# Patient Record
Sex: Male | Born: 1955 | Race: White | Hispanic: No | Marital: Single | Smoking: Never smoker
Health system: Southern US, Community
[De-identification: ages and names within clinical notes are randomized; demographics above are authoritative.]

## PROBLEM LIST (undated history)

## (undated) DIAGNOSIS — I10 Essential (primary) hypertension: Secondary | ICD-10-CM

## (undated) HISTORY — PX: FRACTURE SURGERY: SHX138

## (undated) HISTORY — PX: ANKLE FRACTURE SURGERY: SHX122

---

## 2018-07-12 ENCOUNTER — Ambulatory Visit: Admit: 2018-07-12 | Discharge: 2018-07-12 | Payer: PRIVATE HEALTH INSURANCE

## 2018-07-12 DIAGNOSIS — R079 Chest pain, unspecified: Secondary | ICD-10-CM

## 2018-07-12 NOTE — Progress Notes (Signed)
TREADMILL STRESS TEST          07/12/18    NAME:   Dustin Keller DOB:  Oct 07, 1956   MRN:   [id number] GENDER:   male   PCP:  No primary care provider on file. AGE: 62 y.o.   PCP Fax:  None       Indications:  Chest pain, unspecified type [R07.9]  ORDERING PROVIDER:  T. Benevich  ([place], [place])    VITALS:  BP 124/78    Pulse 62    Ht 5\' 6"  (1.676 m)    Wt 205 lb (93 kg)    BMI 33.09 kg/m??     Procedure:     The patient performed a treadmill exercise stress test using Bruce protocol, completing 12:31 minutes and completed an estimated workload of 17.2 metabolic equivalents (METS).  The test was terminated due to dyspnea, patient fatigue and target HR achieved.  The heart rate was 62 beats per minute at baseline and increased to 165 beats at peak exercise, which was 105% of maximum predicted heart rate. The rest blood pressure was 124/78 mm/Hg, and increased to 170/80 mm/Hg at peak exercise which is a normal response.    FINDINGS:   RESTING/PRE STRESS ECG: normal sinus rhythm    STRESS/POST EXERCISE ECG: normal sinus rhythm, sinus tachycardia, rare PVCs and 1 couplet; one 3 beat run SVT during mid exercise.      ST Segments Cannot Be Interpreted: Non Applicable    IMPRESSION:   ECG: No Ischemic ECG Changes.  Arrhythmia(s) noted:rare PVCs, 1 couplet and 1 3 beat run SVT during mid exercise .  Symptoms: The patient did not complain of chest pain.  Exercise Tolerance: Excellent   Duke score   7.5   Low risk       Electronically signed by: Broadus John, MD, [facility]  ____________________________________________  [facility]  [address], [address], [place], [place]  [address]  Tel:  [phone]      Fax:  [phone]  [email or web address]

## 2020-08-05 ENCOUNTER — Ambulatory Visit (INDEPENDENT_AMBULATORY_CARE_PROVIDER_SITE_OTHER): Payer: Medicare Other | Admitting: Student in an Organized Health Care Education/Training Program

## 2020-08-05 ENCOUNTER — Other Ambulatory Visit: Payer: Self-pay

## 2020-08-05 VITALS — BP 128/80 | HR 81 | Ht 67.0 in | Wt 219.6 lb

## 2020-08-05 DIAGNOSIS — Z131 Encounter for screening for diabetes mellitus: Secondary | ICD-10-CM | POA: Diagnosis not present

## 2020-08-05 DIAGNOSIS — F419 Anxiety disorder, unspecified: Secondary | ICD-10-CM

## 2020-08-05 DIAGNOSIS — Z0000 Encounter for general adult medical examination without abnormal findings: Secondary | ICD-10-CM | POA: Insufficient documentation

## 2020-08-05 DIAGNOSIS — E782 Mixed hyperlipidemia: Secondary | ICD-10-CM

## 2020-08-05 DIAGNOSIS — Z23 Encounter for immunization: Secondary | ICD-10-CM | POA: Diagnosis not present

## 2020-08-05 DIAGNOSIS — M1711 Unilateral primary osteoarthritis, right knee: Secondary | ICD-10-CM

## 2020-08-05 DIAGNOSIS — I10 Essential (primary) hypertension: Secondary | ICD-10-CM

## 2020-08-05 DIAGNOSIS — Z13220 Encounter for screening for lipoid disorders: Secondary | ICD-10-CM

## 2020-08-05 DIAGNOSIS — F329 Major depressive disorder, single episode, unspecified: Secondary | ICD-10-CM

## 2020-08-05 DIAGNOSIS — Z1329 Encounter for screening for other suspected endocrine disorder: Secondary | ICD-10-CM | POA: Diagnosis not present

## 2020-08-05 LAB — POCT GLYCOSYLATED HEMOGLOBIN (HGB A1C): Hemoglobin A1C: 5.3 % (ref 4.0–5.6)

## 2020-08-05 NOTE — Progress Notes (Signed)
SUBJECTIVE:   CHIEF COMPLAINT / HPI: establish care Preferred name: Elijah Washington  Complaint of R knee pain-patient had arthroscopy with partial meniscectomy in 2010.  He walks up to 3 miles at a time 3 to 5 days/week.  He has some worsening knee pain mostly on midline that is exacerbated with activity.  Denies clicking popping locking or giving out.  He takes ibuprofen 2-3 mornings per week which reduces the pain.  Denies GI upset.  Overall, patient feels well and is doing well. Current medications are lisinopril, paroxetine, as needed sildenafil.  He does not need any refills until January. HTN: Lisinopril 10 mg daily without negative side effects Anxiety: Paroxetine 20 mg daily without negative side effects.  Has been taking for approximately 6 years.  Started taking during his divorce and has been on it ever since.  Denies having any current anxiety or depression symptoms.  He is not interested in a trial off of the medication or any counseling at this time.  Patient endorses an overall healthy diet plus walking for exercise and bowling.  He is trying to lose weight in order to help with his right knee pain. He has completed his Covid vaccine series. His last colonoscopy 07/18/2015 and was normal with 10-year follow-up in 2026. Has history of trigger finger in 2016. PSA checked in 2017 was normal. Had kidney stones in 2018.  Denies tobacco use or illicit drug use.  Drinks approximately 1 beer per day. Patient is retired and moved to [place] from [place].  His brother lives locally and he likes the weather down here. Family history overall insignificant.  OBJECTIVE:   BP 128/80   Pulse 81   Ht 5\' 7"  (1.702 m)   Wt 219 lb 9.6 oz (99.6 kg)   SpO2 98%   BMI 34.39 kg/m   General: NAD, pleasant, able to participate in exam Head: /AT.   Eyes:  EOMI, PERRL. Nose:  Septum midline  Mouth:  MMM, tonsils non-erythematous, non-edematous.   Cardiac: RRR, normal heart sounds, no murmurs. 2+  radial and PT pulses bilaterally Respiratory: CTAB, normal effort, No wheezes, rales or rhonchi Abdomen: soft, nontender, nondistended, no hepatic or splenomegaly, +BS Knee: Observation: no gross deformity Palpation: tenderness to medial joint line with palpation and with valgus/varus stress. No swelling or bakers cyst ROM: full and symmetrical Strength: full and symmetrical Special tests: positive pain with patellar grind on R only Skin: warm and dry, no rashes noted Neuro: alert and oriented, no focal deficits Psych: Normal affect and mood  ASSESSMENT/PLAN:   Encounter for medical examination to establish care Reviewed PMHx. Does not need refills at this time - recommended mychart activation - flu shot given today - baseline labs collected  Hyperlipemia Slightly elevated lipid panel. Sent patient message about lifestyle change vs starting statin. Waiting for his preference. Either way, recheck in 3-4 months  Hypertension Lisinopril 10mg  daily.  Does not need refills at this time. Well controlled in office today and asymptomatic  Osteoarthritis Patient attempting to lose weight to improve symptoms. No recent worsening or injury.  Discussed treatment options for shared decision making. - recommend decreasing NSAID use and substitute tylenol if it works - continue walking and weight loss and substitute lower impact exercises if possible such as cycling, swimming, elliptical. - given home rehab exercises - if not improved in a couple months, return to consider steroid injection  Anxiety and depression Chronic, stable. On long-term paroxetine.  Declines a trial off medication or counseling  [place]

## 2020-08-05 NOTE — Patient Instructions (Signed)
It was a pleasure to see you today!  To summarize our discussion for this visit:  We are checking some baseline labs today and I will contact you with any abnormal results. Please set up mychart for an easy way to communicate in the future  For your knee pain, I would recommend decreasing ibuprofen use and instead try tylenol. I've attached some rehab exercises to strengthen your knee and help prevent further discomfort. We can consider steroid knee injections in the future if impacting your daily activity.  Some additional health maintenance measures we should update are: Health Maintenance Due  Topic Date Due   Hepatitis C Screening  Never done   TETANUS/TDAP  Never done   COLONOSCOPY  Never done   PNA vac Low Risk Adult (1 of 2 - PCV13) Never done   INFLUENZA VACCINE  Never done      Please return to our clinic to see me in about 6 months or sooner if needed.  Call the clinic at [phone] if your symptoms worsen or you have any concerns.   Thank you for allowing me to take part in your care,  Dr. Jamelle Rushing   Journal for Nurse Practitioners, 15(4), [phone]. Retrieved August 14, 2018 from [email or web address]">  Knee Exercises Ask your health care provider which exercises are safe for you. Do exercises exactly as told by your health care provider and adjust them as directed. It is normal to feel mild stretching, pulling, tightness, or discomfort as you do these exercises. Stop right away if you feel sudden pain or your pain gets worse. Do not begin these exercises until told by your health care provider. Stretching and range-of-motion exercises These exercises warm up your muscles and joints and improve the movement and flexibility of your knee. These exercises also help to relieve pain and swelling. Knee extension, prone 1. Lie on your abdomen (prone position) on a bed. 2. Place your left / right knee just beyond the edge of the surface so your knee is  not on the bed. You can put a towel under your left / right thigh just above your kneecap for comfort. 3. Relax your leg muscles and allow gravity to straighten your knee (extension). You should feel a stretch behind your left / right knee. 4. Hold this position for __________ seconds. 5. Scoot up so your knee is supported between repetitions. Repeat __________ times. Complete this exercise __________ times a day. Knee flexion, active  1. Lie on your back with both legs straight. If this causes back discomfort, bend your left / right knee so your foot is flat on the floor. 2. Slowly slide your left / right heel back toward your buttocks. Stop when you feel a gentle stretch in the front of your knee or thigh (flexion). 3. Hold this position for __________ seconds. 4. Slowly slide your left / right heel back to the starting position. Repeat __________ times. Complete this exercise __________ times a day. Quadriceps stretch, prone  1. Lie on your abdomen on a firm surface, such as a bed or padded floor. 2. Bend your left / right knee and hold your ankle. If you cannot reach your ankle or pant leg, loop a belt around your foot and grab the belt instead. 3. Gently pull your heel toward your buttocks. Your knee should not slide out to the side. You should feel a stretch in the front of your thigh and knee (quadriceps). 4. Hold this position for __________ seconds. Repeat __________ times.  Complete this exercise __________ times a day. Hamstring, supine 1. Lie on your back (supine position). 2. Loop a belt or towel over the ball of your left / right foot. The ball of your foot is on the walking surface, right under your toes. 3. Straighten your left / right knee and slowly pull on the belt to raise your leg until you feel a gentle stretch behind your knee (hamstring). ? Do not let your knee bend while you do this. ? Keep your other leg flat on the floor. 4. Hold this position for __________  seconds. Repeat __________ times. Complete this exercise __________ times a day. Strengthening exercises These exercises build strength and endurance in your knee. Endurance is the ability to use your muscles for a long time, even after they get tired. Quadriceps, isometric This exercise stretches the muscles in front of your thigh (quadriceps) without moving your knee joint (isometric). 1. Lie on your back with your left / right leg extended and your other knee bent. Put a rolled towel or small pillow under your knee if told by your health care provider. 2. Slowly tense the muscles in the front of your left / right thigh. You should see your kneecap slide up toward your hip or see increased dimpling just above the knee. This motion will push the back of the knee toward the floor. 3. For __________ seconds, hold the muscle as tight as you can without increasing your pain. 4. Relax the muscles slowly and completely. Repeat __________ times. Complete this exercise __________ times a day. Straight leg raises This exercise stretches the muscles in front of your thigh (quadriceps) and the muscles that move your hips (hip flexors). 1. Lie on your back with your left / right leg extended and your other knee bent. 2. Tense the muscles in the front of your left / right thigh. You should see your kneecap slide up or see increased dimpling just above the knee. Your thigh may even shake a bit. 3. Keep these muscles tight as you raise your leg 4-6 inches (10-15 cm) off the floor. Do not let your knee bend. 4. Hold this position for __________ seconds. 5. Keep these muscles tense as you lower your leg. 6. Relax your muscles slowly and completely after each repetition. Repeat __________ times. Complete this exercise __________ times a day. Hamstring, isometric 1. Lie on your back on a firm surface. 2. Bend your left / right knee about __________ degrees. 3. Dig your left / right heel into the surface as if  you are trying to pull it toward your buttocks. Tighten the muscles in the back of your thighs (hamstring) to "dig" as hard as you can without increasing any pain. 4. Hold this position for __________ seconds. 5. Release the tension gradually and allow your muscles to relax completely for __________ seconds after each repetition. Repeat __________ times. Complete this exercise __________ times a day. Hamstring curls If told by your health care provider, do this exercise while wearing ankle weights. Begin with __________ lb weights. Then increase the weight by 1 lb (0.5 kg) increments. Do not wear ankle weights that are more than __________ lb. 1. Lie on your abdomen with your legs straight. 2. Bend your left / right knee as far as you can without feeling pain. Keep your hips flat against the floor. 3. Hold this position for __________ seconds. 4. Slowly lower your leg to the starting position. Repeat __________ times. Complete this exercise __________ times a  day. Squats This exercise strengthens the muscles in front of your thigh and knee (quadriceps). 1. Stand in front of a table, with your feet and knees pointing straight ahead. You may rest your hands on the table for balance but not for support. 2. Slowly bend your knees and lower your hips like you are going to sit in a chair. ? Keep your weight over your heels, not over your toes. ? Keep your lower legs upright so they are parallel with the table legs. ? Do not let your hips go lower than your knees. ? Do not bend lower than told by your health care provider. ? If your knee pain increases, do not bend as low. 3. Hold the squat position for __________ seconds. 4. Slowly push with your legs to return to standing. Do not use your hands to pull yourself to standing. Repeat __________ times. Complete this exercise __________ times a day. Wall slides This exercise strengthens the muscles in front of your thigh and knee (quadriceps). 1. Lean  your back against a smooth wall or door, and walk your feet out 18-24 inches (46-61 cm) from it. 2. Place your feet hip-width apart. 3. Slowly slide down the wall or door until your knees bend __________ degrees. Keep your knees over your heels, not over your toes. Keep your knees in line with your hips. 4. Hold this position for __________ seconds. Repeat __________ times. Complete this exercise __________ times a day. Straight leg raises This exercise strengthens the muscles that rotate the leg at the hip and move it away from your body (hip abductors). 1. Lie on your side with your left / right leg in the top position. Lie so your head, shoulder, knee, and hip line up. You may bend your bottom knee to help you keep your balance. 2. Roll your hips slightly forward so your hips are stacked directly over each other and your left / right knee is facing forward. 3. Leading with your heel, lift your top leg 4-6 inches (10-15 cm). You should feel the muscles in your outer hip lifting. ? Do not let your foot drift forward. ? Do not let your knee roll toward the ceiling. 4. Hold this position for __________ seconds. 5. Slowly return your leg to the starting position. 6. Let your muscles relax completely after each repetition. Repeat __________ times. Complete this exercise __________ times a day. Straight leg raises This exercise stretches the muscles that move your hips away from the front of the pelvis (hip extensors). 1. Lie on your abdomen on a firm surface. You can put a pillow under your hips if that is more comfortable. 2. Tense the muscles in your buttocks and lift your left / right leg about 4-6 inches (10-15 cm). Keep your knee straight as you lift your leg. 3. Hold this position for __________ seconds. 4. Slowly lower your leg to the starting position. 5. Let your leg relax completely after each repetition. Repeat __________ times. Complete this exercise __________ times a day. This  information is not intended to replace advice given to you by your health care provider. Make sure you discuss any questions you have with your health care provider. Document Revised: 08/15/2018 Document Reviewed: 08/15/2018 Elsevier Patient Education  2020 [employer].

## 2020-08-05 NOTE — Assessment & Plan Note (Signed)
Reviewed PMHx. Does not need refills at this time - recommended mychart activation - flu shot given today - baseline labs collected

## 2020-08-06 ENCOUNTER — Encounter: Payer: Self-pay | Admitting: Student in an Organized Health Care Education/Training Program

## 2020-08-06 DIAGNOSIS — E785 Hyperlipidemia, unspecified: Secondary | ICD-10-CM | POA: Insufficient documentation

## 2020-08-06 DIAGNOSIS — M1990 Unspecified osteoarthritis, unspecified site: Secondary | ICD-10-CM | POA: Insufficient documentation

## 2020-08-06 DIAGNOSIS — I10 Essential (primary) hypertension: Secondary | ICD-10-CM | POA: Insufficient documentation

## 2020-08-06 DIAGNOSIS — F32A Depression, unspecified: Secondary | ICD-10-CM | POA: Insufficient documentation

## 2020-08-06 LAB — TSH: TSH: 1.2 u[IU]/mL (ref 0.450–4.500)

## 2020-08-06 LAB — COMPREHENSIVE METABOLIC PANEL
ALT: 32 IU/L (ref 0–44)
AST: 28 IU/L (ref 0–40)
Albumin/Globulin Ratio: 2.1 (ref 1.2–2.2)
Albumin: 4.4 g/dL (ref 3.8–4.8)
Alkaline Phosphatase: 77 IU/L (ref 44–121)
BUN/Creatinine Ratio: 11 (ref 10–24)
BUN: 12 mg/dL (ref 8–27)
Bilirubin Total: 0.6 mg/dL (ref 0.0–1.2)
CO2: 22 mmol/L (ref 20–29)
Calcium: 9.2 mg/dL (ref 8.6–10.2)
Chloride: 108 mmol/L — ABNORMAL HIGH (ref 96–106)
Creatinine, Ser: 1.07 mg/dL (ref 0.76–1.27)
GFR calc Af Amer: 83 mL/min/{1.73_m2} (ref >59)
GFR calc non Af Amer: 72 mL/min/{1.73_m2} (ref >59)
Globulin, Total: 2.1 g/dL (ref 1.5–4.5)
Glucose: 85 mg/dL (ref 65–99)
Potassium: 4.4 mmol/L (ref 3.5–5.2)
Sodium: 142 mmol/L (ref 134–144)
Total Protein: 6.5 g/dL (ref 6.0–8.5)

## 2020-08-06 LAB — LIPID PANEL
Chol/HDL Ratio: 4.1 ratio (ref 0.0–5.0)
Cholesterol, Total: 203 mg/dL — ABNORMAL HIGH (ref 100–199)
HDL: 50 mg/dL (ref >39)
LDL Chol Calc (NIH): 127 mg/dL — ABNORMAL HIGH (ref 0–99)
Triglycerides: 148 mg/dL (ref 0–149)
VLDL Cholesterol Cal: 26 mg/dL (ref 5–40)

## 2020-08-06 LAB — CBC
Hematocrit: 44.6 % (ref 37.5–51.0)
Hemoglobin: 14.8 g/dL (ref 13.0–17.7)
MCH: 30.3 pg (ref 26.6–33.0)
MCHC: 33.2 g/dL (ref 31.5–35.7)
MCV: 91 fL (ref 79–97)
Platelets: 196 10*3/uL (ref 150–450)
RBC: 4.89 x10E6/uL (ref 4.14–5.80)
RDW: 12.9 % (ref 11.6–15.4)
WBC: 5.6 10*3/uL (ref 3.4–10.8)

## 2020-08-06 NOTE — Assessment & Plan Note (Signed)
Slightly elevated lipid panel. Sent patient message about lifestyle change vs starting statin. Waiting for his preference. Either way, recheck in 3-4 months

## 2020-08-06 NOTE — Assessment & Plan Note (Signed)
Lisinopril 10mg  daily.  Does not need refills at this time. Well controlled in office today and asymptomatic

## 2020-08-06 NOTE — Assessment & Plan Note (Signed)
Chronic, stable. On long-term paroxetine.  Declines a trial off medication or counseling

## 2020-08-06 NOTE — Assessment & Plan Note (Signed)
Patient attempting to lose weight to improve symptoms. No recent worsening or injury.  Discussed treatment options for shared decision making. - recommend decreasing NSAID use and substitute tylenol if it works - continue walking and weight loss and substitute lower impact exercises if possible such as cycling, swimming, elliptical. - given home rehab exercises - if not improved in a couple months, return to consider steroid injection

## 2020-09-08 ENCOUNTER — Ambulatory Visit (HOSPITAL_COMMUNITY)
Admission: EM | Admit: 2020-09-08 | Discharge: 2020-09-08 | Disposition: A | Discharge status: Home / Self Care | Payer: Medicare Other | Attending: Urgent Care | Admitting: Urgent Care

## 2020-09-08 ENCOUNTER — Encounter (HOSPITAL_COMMUNITY): Payer: Self-pay | Admitting: Emergency Medicine

## 2020-09-08 ENCOUNTER — Other Ambulatory Visit: Payer: Self-pay

## 2020-09-08 DIAGNOSIS — M5432 Sciatica, left side: Secondary | ICD-10-CM

## 2020-09-08 DIAGNOSIS — M7918 Myalgia, other site: Secondary | ICD-10-CM

## 2020-09-08 HISTORY — DX: Essential (primary) hypertension: I10

## 2020-09-08 MED ORDER — PREDNISONE 20 MG PO TABS: ORAL_TABLET | ORAL | 0 refills | Status: DC

## 2020-09-08 MED ORDER — TIZANIDINE HCL 4 MG PO TABS: 4.0000 mg | ORAL_TABLET | Freq: Every day | ORAL | 0 refills | Status: DC

## 2020-09-08 NOTE — ED Provider Notes (Signed)
[facility]   MRN: [id number] DOB: September 29, 1956  Subjective:   Elijah Washington is a 64 y.o. male presenting for 3-day history of progressively worsening right posterior hip/buttock pain that radiates into his posterior thigh, intermittently causes tightness of the thigh and tingling of the left foot.  Patient states that symptoms started after he did not some work with his friends and friends.  States that he was cleaning them using a stepping letter, actual letter as well.  Denies fall, trauma, hematuria, flank tenderness.  Has history of a kidney stone but states this feels very different.  Denies history of arthritis of his back.  Denies heart history, heart failure, CAD.  No current facility-administered medications for this encounter.  Current Outpatient Medications:  .  lisinopril (ZESTRIL) 10 MG tablet, Take 10 mg by mouth daily., Disp: , Rfl:  .  PARoxetine (PAXIL) 20 MG tablet, Take 20 mg by mouth daily., Disp: , Rfl:  .  sildenafil (VIAGRA) 25 MG tablet, Take 25 mg by mouth daily as needed for erectile dysfunction., Disp: , Rfl:    No Known Allergies  Past Medical History:  Diagnosis Date  . Hypertension      Past Surgical History:  Procedure Laterality Date  . ANKLE FRACTURE SURGERY      Family History  Problem Relation Age of Onset  . Heart failure Mother   . Cancer Father     Social History   Tobacco Use  . Smoking status: Never Smoker  . Smokeless tobacco: Never Used  Substance Use Topics  . Alcohol use: Not on file  . Drug use: Not on file    ROS   Objective:   Vitals: BP 135/87 (BP Location: Left Arm)   Pulse 76   Temp 98.2 F (36.8 C) (Oral)   Resp 18   SpO2 100%   Physical Exam Constitutional:      General: He is not in acute distress.    Appearance: Normal appearance. He is well-developed and normal weight. He is not ill-appearing, toxic-appearing or diaphoretic.  HENT:     Head: Normocephalic and atraumatic.      Right Ear: External ear normal.     Left Ear: External ear normal.     Nose: Nose normal.     Mouth/Throat:     Pharynx: Oropharynx is clear.  Eyes:     General: No scleral icterus.       Right eye: No discharge.        Left eye: No discharge.     Extraocular Movements: Extraocular movements intact.     Conjunctiva/sclera: Conjunctivae normal.     Pupils: Pupils are equal, round, and reactive to light.  Cardiovascular:     Rate and Rhythm: Normal rate.  Pulmonary:     Effort: Pulmonary effort is normal.  Musculoskeletal:     Cervical back: Normal range of motion.     Lumbar back: No swelling, edema, deformity, signs of trauma, lacerations, spasms, tenderness or bony tenderness. Normal range of motion. Positive left straight leg raise test. Negative right straight leg raise test. No scoliosis.  Skin:    General: Skin is warm and dry.  Neurological:     Mental Status: He is alert and oriented to person, place, and time.     Motor: No weakness.     Coordination: Coordination normal.     Gait: Gait normal.     Deep Tendon Reflexes: Reflexes normal.  Psychiatric:  Mood and Affect: Mood normal.        Behavior: Behavior normal.        Thought Content: Thought content normal.        Judgment: Judgment normal.      Assessment and Plan :   PDMP not reviewed this encounter.  1. Sciatica of left side   2. Pain in left buttock     Will manage conservatively for sciatica, back strain with prednisone (at patient's request) and muscle relaxant, rest and modification of physical activity.  Anticipatory guidance provided.  Consider imaging if symptoms persist or worsen.  Counseled patient on potential for adverse effects with medications prescribed/recommended today, ER and return-to-clinic precautions discussed, patient verbalized understanding.    Wallis Bamberg, PA-C 09/08/20 1715

## 2020-09-08 NOTE — ED Triage Notes (Signed)
Pt c/o left hip pain radiating down his left leg. Pt states on Friday he was up on a ladder cleaning the ceiling fans. Pt states he has trouble with ROM and standing for long periods of time.

## 2020-09-12 ENCOUNTER — Encounter: Payer: Self-pay | Admitting: Student in an Organized Health Care Education/Training Program

## 2020-09-30 ENCOUNTER — Ambulatory Visit: Payer: Medicare Other | Attending: Internal Medicine

## 2020-09-30 ENCOUNTER — Other Ambulatory Visit (HOSPITAL_BASED_OUTPATIENT_CLINIC_OR_DEPARTMENT_OTHER): Payer: Self-pay | Admitting: Internal Medicine

## 2020-09-30 DIAGNOSIS — Z23 Encounter for immunization: Secondary | ICD-10-CM

## 2020-09-30 NOTE — Progress Notes (Signed)
   [id number]  Name:  Quirino Kakos. Massmann    MRN: [id number] DOB: Jun 19, 1956  09/30/2020  Mr. Surber was observed post Covid-19 immunization for 15 minutes without incident. He was provided with Vaccine Information Sheet and instruction to access the V-Safe system.   Mr. Raucci was instructed to call 911 with any severe reactions post vaccine: Marland Kitchen Difficulty breathing  . Swelling of face and throat  . A fast heartbeat  . A bad rash all over body  . Dizziness and weakness   Immunizations Administered    Name Date Dose VIS Date Route   Pfizer COVID-19 Vaccine 09/30/2020 10:17 AM 0.3 mL 08/27/2020 Intramuscular   Manufacturer: [employer], [employer]   Lot: [id number]   NDC: [address]-5

## 2020-10-01 MED FILL — PFIZER-BIONTECH COVID-19 VA: 30 | 1 days supply | Qty: 0 | Fill #0

## 2020-11-12 ENCOUNTER — Encounter: Payer: Self-pay | Admitting: Student in an Organized Health Care Education/Training Program

## 2020-11-12 MED ORDER — PAROXETINE HCL 20 MG PO TABS
20.0000 mg | ORAL_TABLET | Freq: Every day | ORAL | 0 refills | Status: DC
Start: 2020-11-12 — End: 2021-02-04

## 2020-11-12 MED ORDER — LISINOPRIL 10 MG PO TABS
10.0000 mg | ORAL_TABLET | Freq: Every day | ORAL | 0 refills | Status: DC
Start: 2020-11-12 — End: 2021-02-04

## 2021-01-30 ENCOUNTER — Other Ambulatory Visit: Payer: Self-pay

## 2021-01-30 ENCOUNTER — Ambulatory Visit (INDEPENDENT_AMBULATORY_CARE_PROVIDER_SITE_OTHER): Payer: Medicare Other | Admitting: Student in an Organized Health Care Education/Training Program

## 2021-01-30 ENCOUNTER — Encounter: Payer: Self-pay | Admitting: Student in an Organized Health Care Education/Training Program

## 2021-01-30 VITALS — BP 110/62 | HR 69 | Wt 210.8 lb

## 2021-01-30 DIAGNOSIS — E782 Mixed hyperlipidemia: Secondary | ICD-10-CM

## 2021-01-30 DIAGNOSIS — I10 Essential (primary) hypertension: Secondary | ICD-10-CM | POA: Diagnosis not present

## 2021-01-30 DIAGNOSIS — Z23 Encounter for immunization: Secondary | ICD-10-CM | POA: Diagnosis not present

## 2021-01-30 DIAGNOSIS — M1711 Unilateral primary osteoarthritis, right knee: Secondary | ICD-10-CM | POA: Diagnosis not present

## 2021-01-30 NOTE — Patient Instructions (Signed)
It was a pleasure to see you today!  To summarize our discussion for this visit:  We are checking your cholesterol today and can then decide if medication or lifestyle changes alone are indicated. I'll let you know results via mychart  You will get your pneumonia vaccine as well as Tdap today.   Please see me again if you have any desire to treat your knee pain in a different way  Some additional health maintenance measures we should update are: Health Maintenance Due  Topic Date Due  . Hepatitis C Screening  Never done  . TETANUS/TDAP  Never done  . COLONOSCOPY (Pts 45-69yrs Insurance coverage will need to be confirmed)  Never done  . PNA vac Low Risk Adult (1 of 2 - PCV13) Never done  .   Call the clinic at [phone] if your symptoms worsen or you have any concerns.   Thank you for allowing me to take part in your care,  Dr. Jamelle Rushing

## 2021-01-30 NOTE — Progress Notes (Signed)
    SUBJECTIVE:   CHIEF COMPLAINT / HPI: cholesterol check  Hyperlipidemia-patient had elevated lipids at last check and has made lifestyle changes including cutting out all alcohol for several weeks and really cutting back on any sweets.  He bowls occasionally and plans to start golfing now that the weather is warming up but does not get any regular exercise at this time.  Knee pain-patient endorses mild bilateral knee pain which is present when he tried to run several months ago but otherwise does not notice it on a day-to-day basis.  He does not want to do anything for it today but just wanted to bring it up to let me know.  OBJECTIVE:   BP 110/62   Pulse 69   Wt 210 lb 12.8 oz (95.6 kg)   SpO2 97%   BMI 33.02 kg/m   Physical Exam Vitals and nursing note reviewed.  Constitutional:      General: He is not in acute distress.    Appearance: He is obese. He is not ill-appearing.  HENT:     Head: Normocephalic.  Cardiovascular:     Rate and Rhythm: Normal rate and regular rhythm.     Pulses: Normal pulses.  Pulmonary:     Effort: Pulmonary effort is normal.     Breath sounds: Normal breath sounds.  Neurological:     Mental Status: He is alert.  Psychiatric:        Mood and Affect: Mood normal.        Thought Content: Thought content normal.    ASSESSMENT/PLAN:   Pneumococcal vaccination administered at current visit Prevnar 20 given today  Osteoarthritis Chronic, unchanged.  Recommended low impact exercise such as swimming, stationary bike  Hypertension Well-controlled, asymptomatic  Hyperlipemia Repeat lipid panel today s/p lifestyle changes ASCVD risk calculated >7.5% -Recommend moderate to high intensity statin starting now Recheck in about 6 months     Leeroy Bock, DO [facility]

## 2021-01-31 DIAGNOSIS — Z23 Encounter for immunization: Secondary | ICD-10-CM | POA: Insufficient documentation

## 2021-01-31 LAB — LIPID PANEL
Chol/HDL Ratio: 4.5 ratio (ref 0.0–5.0)
Cholesterol, Total: 192 mg/dL (ref 100–199)
HDL: 43 mg/dL (ref >39)
LDL Chol Calc (NIH): 129 mg/dL — ABNORMAL HIGH (ref 0–99)
Triglycerides: 108 mg/dL (ref 0–149)
VLDL Cholesterol Cal: 20 mg/dL (ref 5–40)

## 2021-01-31 MED ORDER — ATORVASTATIN CALCIUM 40 MG PO TABS: 40.0000 mg | ORAL_TABLET | Freq: Every day | ORAL | 3 refills | Status: DC

## 2021-01-31 NOTE — Assessment & Plan Note (Addendum)
Repeat lipid panel today s/p lifestyle changes ASCVD risk calculated >7.5% -Recommend moderate to high intensity statin starting now Recheck in about 6 months

## 2021-01-31 NOTE — Assessment & Plan Note (Signed)
Prevnar 20 given today. 

## 2021-01-31 NOTE — Assessment & Plan Note (Signed)
Chronic, unchanged.  Recommended low impact exercise such as swimming, stationary bike

## 2021-01-31 NOTE — Assessment & Plan Note (Signed)
Well-controlled, asymptomatic

## 2021-02-04 ENCOUNTER — Other Ambulatory Visit: Payer: Self-pay | Admitting: Student in an Organized Health Care Education/Training Program

## 2021-04-21 ENCOUNTER — Ambulatory Visit
Admission: RE | Admit: 2021-04-21 | Discharge: 2021-04-21 | Disposition: A | Discharge status: Home / Self Care | Payer: Medicare Other | Source: Ambulatory Visit | Attending: Family Medicine | Admitting: Family Medicine

## 2021-04-21 ENCOUNTER — Ambulatory Visit (INDEPENDENT_AMBULATORY_CARE_PROVIDER_SITE_OTHER): Payer: Medicare Other | Admitting: Student in an Organized Health Care Education/Training Program

## 2021-04-21 ENCOUNTER — Encounter: Payer: Self-pay | Admitting: Student in an Organized Health Care Education/Training Program

## 2021-04-21 ENCOUNTER — Other Ambulatory Visit: Payer: Self-pay

## 2021-04-21 VITALS — BP 145/86 | HR 62 | Ht 67.0 in | Wt 212.2 lb

## 2021-04-21 DIAGNOSIS — M1711 Unilateral primary osteoarthritis, right knee: Secondary | ICD-10-CM | POA: Diagnosis not present

## 2021-04-21 DIAGNOSIS — M23303 Other meniscus derangements, unspecified medial meniscus, right knee: Secondary | ICD-10-CM | POA: Diagnosis not present

## 2021-04-21 DIAGNOSIS — M792 Neuralgia and neuritis, unspecified: Secondary | ICD-10-CM

## 2021-04-21 DIAGNOSIS — M5410 Radiculopathy, site unspecified: Secondary | ICD-10-CM

## 2021-04-21 MED ORDER — NAPROXEN SODIUM 550 MG PO TABS: 550.0000 mg | ORAL_TABLET | Freq: Two times a day (BID) | ORAL | 0 refills | Status: AC

## 2021-04-21 NOTE — Patient Instructions (Signed)
It was a pleasure to see you today!  To summarize our discussion for this visit: For your left buttock/leg pain- I'd like to take a look at your back to look for signs of nerve impingement so am ordering an xray. The antiinflammatories should help with that as well. Please use topical voltaren gel for an extra help. For your knee pain- we will get an xray to confirm what we suspect is arthritis and degenerative joint disease (likely medial meniscus). Use naproxen for 5-7 days with food as well as the home exercises for several weeks. If not seeing improvement, please return and we can discuss formal PT and/or steroid injections.   Some additional health maintenance measures we should update are: Health Maintenance Due  Topic Date Due   Hepatitis C Screening  Never done   TETANUS/TDAP  Never done   COLONOSCOPY (Pts 45-24yrs Insurance coverage will need to be confirmed)  Never done   Zoster Vaccines- Shingrix (1 of 2) Never done   PNA vac Low Risk Adult (1 of 2 - PCV13) 07/27/2019   COVID-19 Vaccine (4 - Booster for Pfizer series) 01/28/2021     Please return to our clinic to see me 4-6 weeks.  Call the clinic at [phone] if your symptoms worsen or you have any concerns.   Thank you for allowing me to take part in your care,  Dr. Jamelle Rushing  Knee Exercises Ask your health care provider which exercises are safe for you. Do exercises exactly as told by your health care provider and adjust them as directed. It is normal to feel mild stretching, pulling, tightness, or discomfort as you do these exercises. Stop right away if you feel sudden pain or your pain gets worse. Do not begin these exercises until told by your health care provider. Stretching and range-of-motion exercises These exercises warm up your muscles and joints and improve the movement and flexibility of your knee. These exercises also help to relieve pain andswelling. Knee extension, prone Lie on your abdomen (prone  position) on a bed. Place your left / right knee just beyond the edge of the surface so your knee is not on the bed. You can put a towel under your left / right thigh just above your kneecap for comfort. Relax your leg muscles and allow gravity to straighten your knee (extension). You should feel a stretch behind your left / right knee. Hold this position for __________ seconds. Scoot up so your knee is supported between repetitions. Repeat __________ times. Complete this exercise __________ times a day. Knee flexion, active  Lie on your back with both legs straight. If this causes back discomfort, bend your left / right knee so your foot is flat on the floor. Slowly slide your left / right heel back toward your buttocks. Stop when you feel a gentle stretch in the front of your knee or thigh (flexion). Hold this position for __________ seconds. Slowly slide your left / right heel back to the starting position. Repeat __________ times. Complete this exercise __________ times a day. Quadriceps stretch, prone  Lie on your abdomen on a firm surface, such as a bed or padded floor. Bend your left / right knee and hold your ankle. If you cannot reach your ankle or pant leg, loop a belt around your foot and grab the belt instead. Gently pull your heel toward your buttocks. Your knee should not slide out to the side. You should feel a stretch in the front of your thigh  and knee (quadriceps). Hold this position for __________ seconds. Repeat __________ times. Complete this exercise __________ times a day. Hamstring, supine Lie on your back (supine position). Loop a belt or towel over the ball of your left / right foot. The ball of your foot is on the walking surface, right under your toes. Straighten your left / right knee and slowly pull on the belt to raise your leg until you feel a gentle stretch behind your knee (hamstring). Do not let your knee bend while you do this. Keep your other leg flat on  the floor. Hold this position for __________ seconds. Repeat __________ times. Complete this exercise __________ times a day. Strengthening exercises These exercises build strength and endurance in your knee. Endurance is theability to use your muscles for a long time, even after they get tired. Quadriceps, isometric This exercise stretches the muscles in front of your thigh (quadriceps) without moving your knee joint (isometric). Lie on your back with your left / right leg extended and your other knee bent. Put a rolled towel or small pillow under your knee if told by your health care provider. Slowly tense the muscles in the front of your left / right thigh. You should see your kneecap slide up toward your hip or see increased dimpling just above the knee. This motion will push the back of the knee toward the floor. For __________ seconds, hold the muscle as tight as you can without increasing your pain. Relax the muscles slowly and completely. Repeat __________ times. Complete this exercise __________ times a day. Straight leg raises This exercise stretches the muscles in front of your thigh (quadriceps) and the muscles that move your hips (hip flexors). Lie on your back with your left / right leg extended and your other knee bent. Tense the muscles in the front of your left / right thigh. You should see your kneecap slide up or see increased dimpling just above the knee. Your thigh may even shake a bit. Keep these muscles tight as you raise your leg 4-6 inches (10-15 cm) off the floor. Do not let your knee bend. Hold this position for __________ seconds. Keep these muscles tense as you lower your leg. Relax your muscles slowly and completely after each repetition. Repeat __________ times. Complete this exercise __________ times a day. Hamstring, isometric Lie on your back on a firm surface. Bend your left / right knee about __________ degrees. Dig your left / right heel into the surface  as if you are trying to pull it toward your buttocks. Tighten the muscles in the back of your thighs (hamstring) to "dig" as hard as you can without increasing any pain. Hold this position for __________ seconds. Release the tension gradually and allow your muscles to relax completely for __________ seconds after each repetition. Repeat __________ times. Complete this exercise __________ times a day. Hamstring curls If told by your health care provider, do this exercise while wearing ankle weights. Begin with __________ lb weights. Then increase the weight by 1 lb (0.5 kg) increments. Do not wear ankle weights that are more than __________ lb. Lie on your abdomen with your legs straight. Bend your left / right knee as far as you can without feeling pain. Keep your hips flat against the floor. Hold this position for __________ seconds. Slowly lower your leg to the starting position. Repeat __________ times. Complete this exercise __________ times a day. Squats This exercise strengthens the muscles in front of your thigh and knee (  quadriceps). Stand in front of a table, with your feet and knees pointing straight ahead. You may rest your hands on the table for balance but not for support. Slowly bend your knees and lower your hips like you are going to sit in a chair. Keep your weight over your heels, not over your toes. Keep your lower legs upright so they are parallel with the table legs. Do not let your hips go lower than your knees. Do not bend lower than told by your health care provider. If your knee pain increases, do not bend as low. Hold the squat position for __________ seconds. Slowly push with your legs to return to standing. Do not use your hands to pull yourself to standing. Repeat __________ times. Complete this exercise __________ times a day. Wall slides This exercise strengthens the muscles in front of your thigh and knee (quadriceps). Lean your back against a smooth wall or  door, and walk your feet out 18-24 inches (46-61 cm) from it. Place your feet hip-width apart. Slowly slide down the wall or door until your knees bend __________ degrees. Keep your knees over your heels, not over your toes. Keep your knees in line with your hips. Hold this position for __________ seconds. Repeat __________ times. Complete this exercise __________ times a day. Straight leg raises This exercise strengthens the muscles that rotate the leg at the hip and move it away from your body (hip abductors). Lie on your side with your left / right leg in the top position. Lie so your head, shoulder, knee, and hip line up. You may bend your bottom knee to help you keep your balance. Roll your hips slightly forward so your hips are stacked directly over each other and your left / right knee is facing forward. Leading with your heel, lift your top leg 4-6 inches (10-15 cm). You should feel the muscles in your outer hip lifting. Do not let your foot drift forward. Do not let your knee roll toward the ceiling. Hold this position for __________ seconds. Slowly return your leg to the starting position. Let your muscles relax completely after each repetition. Repeat __________ times. Complete this exercise __________ times a day. Straight leg raises This exercise stretches the muscles that move your hips away from the front of the pelvis (hip extensors). Lie on your abdomen on a firm surface. You can put a pillow under your hips if that is more comfortable. Tense the muscles in your buttocks and lift your left / right leg about 4-6 inches (10-15 cm). Keep your knee straight as you lift your leg. Hold this position for __________ seconds. Slowly lower your leg to the starting position. Let your leg relax completely after each repetition. Repeat __________ times. Complete this exercise __________ times a day. This information is not intended to replace advice given to you by your health care  provider. Make sure you discuss any questions you have with your healthcare provider. Document Revised: 08/15/2018 Document Reviewed: 08/15/2018 Elsevier Patient Education  2022 [employer].

## 2021-04-21 NOTE — Progress Notes (Signed)
SUBJECTIVE:   CHIEF COMPLAINT / HPI: knee and sciatic pain  R Knee pain- chronic. Arthroscope in the distant past and had no pain for an extended time. Continues to gradually worsen and interfere with daily activities but does not prevent him from doing anything. Has not had swelling or redness. Has not tried treatments. No locking or clicking.  L Sciatic pain- more active than usual the past few weeks with bowling tournaments and travel. Has flare ups occasionally and had one this weekend when coming home from bowling. Tried tylenol and helped to relieve the pain temporarily. It is a constant achy pain the radiates down his leg and has some numbness on outer left foot occasionally. Has had frequent air travel as well in the past few weeks. No swelling, skin changes in leg. No SOB. This is similar to past flares but about 4-5 pain level and currently a 2. In the past has had level 10 pain. Worse when goes from sitting to standing position and worse in the morning. Improves with movement. Woke him up from sleep Sunday night. Was able to get back to sleep with tylenol.  Bowled in several tournaments. 30-35 games in 10 days.   OBJECTIVE:   BP (!) 145/86   Pulse 62   Ht 5\' 7"  (1.702 m)   Wt 212 lb 3.2 oz (96.3 kg)   SpO2 99%   BMI 33.24 kg/m   Physical Exam Vitals and nursing note reviewed.  Constitutional:      General: He is not in acute distress.    Appearance: Normal appearance. He is not ill-appearing or toxic-appearing.  Pulmonary:     Effort: Pulmonary effort is normal.  Musculoskeletal:     Thoracic back: Normal.     Lumbar back: Tenderness (tenderness to Left upper mid buttock) present. No bony tenderness. Normal range of motion. Positive left straight leg raise test. Negative right straight leg raise test.     Right hip: Normal.     Left hip: Normal range of motion. Normal strength.     Right upper leg: Normal.     Left upper leg: No swelling, deformity, tenderness or bony  tenderness.     Right knee: Crepitus (with mcmurray) present. No swelling, deformity, effusion, erythema or bony tenderness. Normal range of motion. Tenderness (medial joint line) present. No LCL laxity, MCL laxity, ACL laxity or PCL laxity. Normal alignment and normal patellar mobility. Normal pulse.     Instability Tests: Anterior drawer test negative. Posterior drawer test negative. Anterior Lachman test negative. Medial McMurray test negative and lateral McMurray test negative.     Left knee: Normal.     Instability Tests: Anterior drawer test negative. Posterior drawer test negative. Anterior Lachman test negative. Medial McMurray test negative and lateral McMurray test negative.     Right lower leg: Normal.     Left lower leg: Normal.  Neurological:     Mental Status: He is alert.  Psychiatric:        Mood and Affect: Mood normal.        Behavior: Behavior normal.   ASSESSMENT/PLAN:   Osteoarthritis R knee arthritis. Likely degenerative after remote history of injury. Suspect degenerative meniscal pathology contributing.  Standing xray and complete of R knee reveled 4 compartment disease.  Recommended home rehab exercises and tylenol/naproxen as needed for 4-6 weeks.  Return if not improved and consider steroid injection vs ortho referral for repeat arthroscope.  Neuralgia Patient has h/o sciatic nerve compression and states  that this pain is similar. History and exam would agree with this diagnosis but also has component on spinal nerve root compression. Obtaining lumbar xray today to evaluate for vertebral pathology.  Recommended home PT as well as naproxen x1 week Return if not improving in a couple weeks. Can consider steroid injection vs formal PT     Leeroy Bock, DO [facility]

## 2021-04-23 ENCOUNTER — Encounter: Payer: Self-pay | Admitting: Student in an Organized Health Care Education/Training Program

## 2021-04-23 DIAGNOSIS — M792 Neuralgia and neuritis, unspecified: Secondary | ICD-10-CM | POA: Insufficient documentation

## 2021-04-23 NOTE — Assessment & Plan Note (Addendum)
R knee arthritis. Likely degenerative after remote history of injury. Suspect degenerative meniscal pathology contributing.  Standing xray and complete of R knee reveled 4 compartment disease.  Recommended home rehab exercises and tylenol/naproxen as needed for 4-6 weeks.  Return if not improved and consider steroid injection vs ortho referral for repeat arthroscope.

## 2021-04-23 NOTE — Assessment & Plan Note (Signed)
Patient has h/o sciatic nerve compression and states that this pain is similar. History and exam would agree with this diagnosis but also has component on spinal nerve root compression. Obtaining lumbar xray today to evaluate for vertebral pathology.  Recommended home PT as well as naproxen x1 week Return if not improving in a couple weeks. Can consider steroid injection vs formal PT

## 2021-05-04 ENCOUNTER — Other Ambulatory Visit: Payer: Self-pay | Admitting: Student in an Organized Health Care Education/Training Program

## 2021-08-01 ENCOUNTER — Other Ambulatory Visit: Payer: Self-pay | Admitting: Student in an Organized Health Care Education/Training Program

## 2021-10-08 ENCOUNTER — Ambulatory Visit (INDEPENDENT_AMBULATORY_CARE_PROVIDER_SITE_OTHER): Payer: Medicare Other

## 2021-10-08 ENCOUNTER — Encounter: Payer: Self-pay | Admitting: Family Medicine

## 2021-10-08 ENCOUNTER — Ambulatory Visit (INDEPENDENT_AMBULATORY_CARE_PROVIDER_SITE_OTHER): Payer: Medicare Other | Admitting: Family Medicine

## 2021-10-08 ENCOUNTER — Other Ambulatory Visit: Payer: Self-pay

## 2021-10-08 VITALS — BP 128/69 | HR 86 | Ht 67.0 in | Wt 220.2 lb

## 2021-10-08 DIAGNOSIS — I10 Essential (primary) hypertension: Secondary | ICD-10-CM

## 2021-10-08 DIAGNOSIS — Z1211 Encounter for screening for malignant neoplasm of colon: Secondary | ICD-10-CM

## 2021-10-08 DIAGNOSIS — Z23 Encounter for immunization: Secondary | ICD-10-CM

## 2021-10-08 DIAGNOSIS — F32A Depression, unspecified: Secondary | ICD-10-CM

## 2021-10-08 DIAGNOSIS — M7989 Other specified soft tissue disorders: Secondary | ICD-10-CM

## 2021-10-08 DIAGNOSIS — Z1159 Encounter for screening for other viral diseases: Secondary | ICD-10-CM | POA: Diagnosis not present

## 2021-10-08 DIAGNOSIS — F419 Anxiety disorder, unspecified: Secondary | ICD-10-CM

## 2021-10-08 MED ORDER — PAROXETINE HCL 20 MG PO TABS: 20.0000 mg | ORAL_TABLET | Freq: Every day | ORAL | 0 refills | Status: DC

## 2021-10-08 MED ORDER — LISINOPRIL 10 MG PO TABS
10.0000 mg | ORAL_TABLET | Freq: Every day | ORAL | 0 refills | Status: DC
Start: 2021-10-08 — End: 2022-01-22

## 2021-10-08 NOTE — Progress Notes (Signed)
    SUBJECTIVE:   CHIEF COMPLAINT / HPI:   Elijah Washington is a 65 yo M who presents for the below.   Annual check up No concerns today. He is feeling good and recently joined a gym. He has increased his water intake. He denies tobacco use hx, he denies alcohol use. States he takes all medications as prescribed. Desires his flu and covid vaccine today. Admits he was told to follow up in 10 years for colonoscopy. Plans to get prior medical records and send to clinic for clarification.   PERTINENT  PMH / PSH: OA, HLD  OBJECTIVE:   BP 128/69   Pulse 86   Ht 5\' 7"  (1.702 m)   Wt 220 lb 3.2 oz (99.9 kg)   SpO2 99%   BMI 34.49 kg/m   Office Visit from 10/08/2021 in [facility]  PHQ-9 Total Score 0      Physical Exam Vitals reviewed.  Constitutional:      General: He is not in acute distress.    Appearance: Normal appearance. He is not ill-appearing, toxic-appearing or diaphoretic.  HENT:     Head: Normocephalic.     Right Ear: External ear normal.     Left Ear: External ear normal.     Nose: Nose normal.  Eyes:     Conjunctiva/sclera: Conjunctivae normal.  Cardiovascular:     Rate and Rhythm: Normal rate and regular rhythm.  Pulmonary:     Effort: Pulmonary effort is normal. No respiratory distress.     Breath sounds: Normal breath sounds. No wheezing, rhonchi or rales.  Abdominal:     General: Abdomen is flat. Bowel sounds are normal.     Palpations: Abdomen is soft.     Tenderness: There is no abdominal tenderness. There is no right CVA tenderness, left CVA tenderness or guarding.  Musculoskeletal:     Cervical back: Neck supple. No tenderness.     Right lower leg: Edema present.     Left lower leg: Edema present.     Comments: 1+ pitting edema  Lymphadenopathy:     Cervical: No cervical adenopathy.  Neurological:     Mental Status: He is alert and oriented to person, place, and time.     Gait: Gait normal.  Psychiatric:         Mood and Affect: Mood normal.        Behavior: Behavior normal.     ASSESSMENT/PLAN:   1. Essential hypertension At goal. Continue current medications.  - Basic Metabolic Panel - lisinopril (ZESTRIL) 10 MG tablet; Take 1 tablet (10 mg total) by mouth daily.  Dispense: 90 tablet; Refill: 0  2. Screening for colon cancer Discussed obtain prior record from PCP to determine timing of next colonoscopy. Patient plans to bring records in when available.   3. Screening for viral disease - Hepatitis C antibody (reflex, frozen specimen)  4. Need for immunization against influenza - Flu Vaccine QUAD High Dose(Fluad)  5. Anxiety and depression PHQ9 reviewed.  - PARoxetine (PAXIL) 20 MG tablet; Take 1 tablet (20 mg total) by mouth daily.  Dispense: 90 tablet; Refill: 0  6. Swelling of lower extremity Found on exam. 1+ pitting edema. No hx of heart failure. Will obtain BNP, consider need for echo for further evaluation v. Compression stocking for dependent edema.  - Brain natriuretic peptide  Borgarnes, DO [facility]

## 2021-10-08 NOTE — Patient Instructions (Addendum)
Thank you for coming in today.  Today we discussed obtaining records from your previous doctor's office to know when it is time for another colonoscopy.  We have these records available please send them to the office and we will scan them in your chart. We also discussed your blood pressure which is at goal today please continue current medications. When labs are available I will communicate via MyChart. Thank you for getting your COVID and flu shot today.  Dr. Salvadore Dom

## 2021-10-09 LAB — BRAIN NATRIURETIC PEPTIDE: BNP: 12.4 pg/mL (ref 0.0–100.0)

## 2021-10-09 LAB — BASIC METABOLIC PANEL
BUN/Creatinine Ratio: 11 (ref 10–24)
BUN: 12 mg/dL (ref 8–27)
CO2: 27 mmol/L (ref 20–29)
Calcium: 9.5 mg/dL (ref 8.6–10.2)
Chloride: 106 mmol/L (ref 96–106)
Creatinine, Ser: 1.13 mg/dL (ref 0.76–1.27)
Glucose: 99 mg/dL (ref 70–99)
Potassium: 4.6 mmol/L (ref 3.5–5.2)
Sodium: 144 mmol/L (ref 134–144)
eGFR: 71 mL/min/{1.73_m2} (ref >59)

## 2021-10-09 LAB — HCV INTERPRETATION

## 2021-10-09 LAB — HCV AB W REFLEX TO QUANT PCR: HCV Ab: <0.1 s/co ratio (ref 0.0–0.9)

## 2021-11-02 IMAGING — DX DG LUMBAR SPINE COMPLETE 4+V
5 series · 5 of 5 positions shown · non-contrast
Comparison: None.

CLINICAL DATA: Right lower extremity radiculopathy

EXAM:
LUMBAR SPINE - COMPLETE 4+ VIEW

[dg lumbar spine complete 4 +v (1 of 5)]
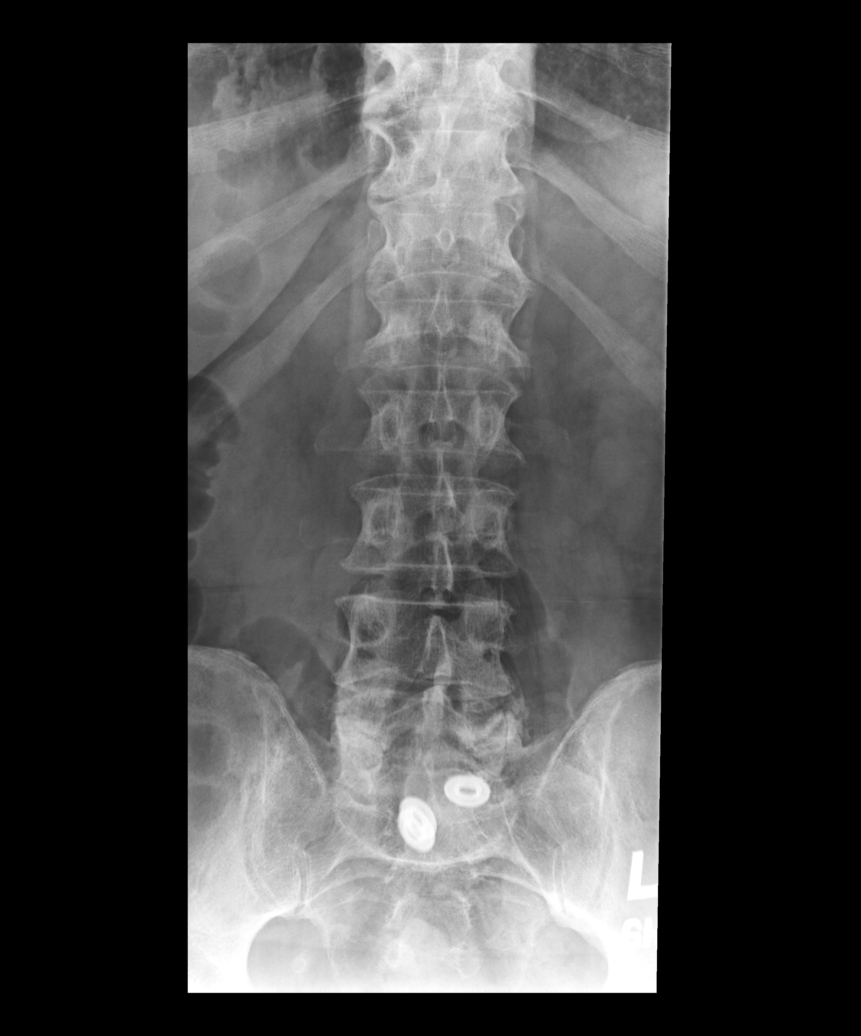

[dg lumbar spine complete 4 +v (2 of 5)]
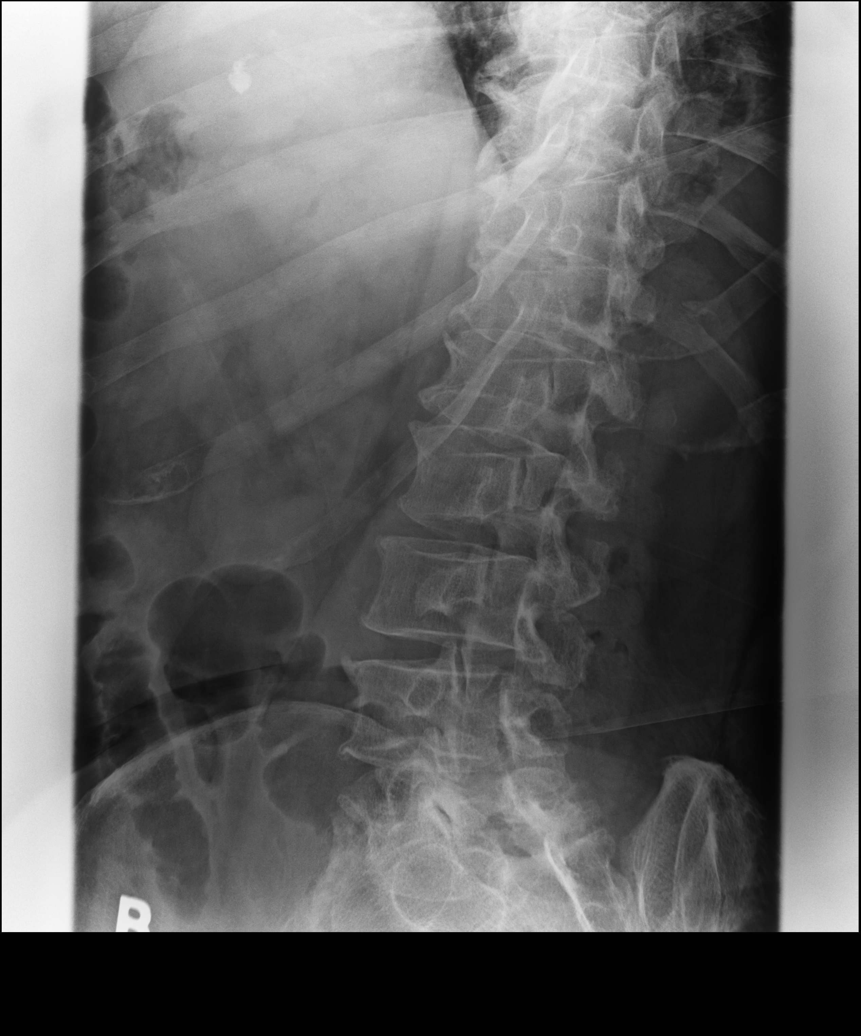

[dg lumbar spine complete 4 +v (3 of 5)]
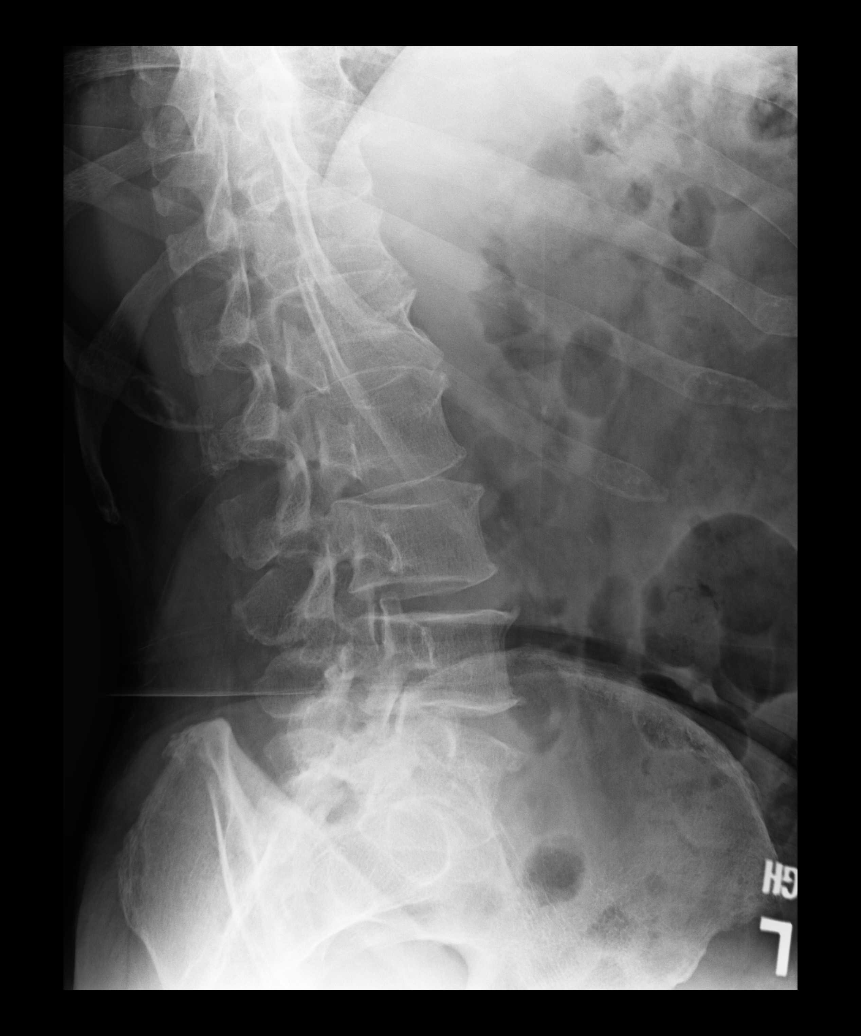

[dg lumbar spine complete 4 +v (4 of 5)]
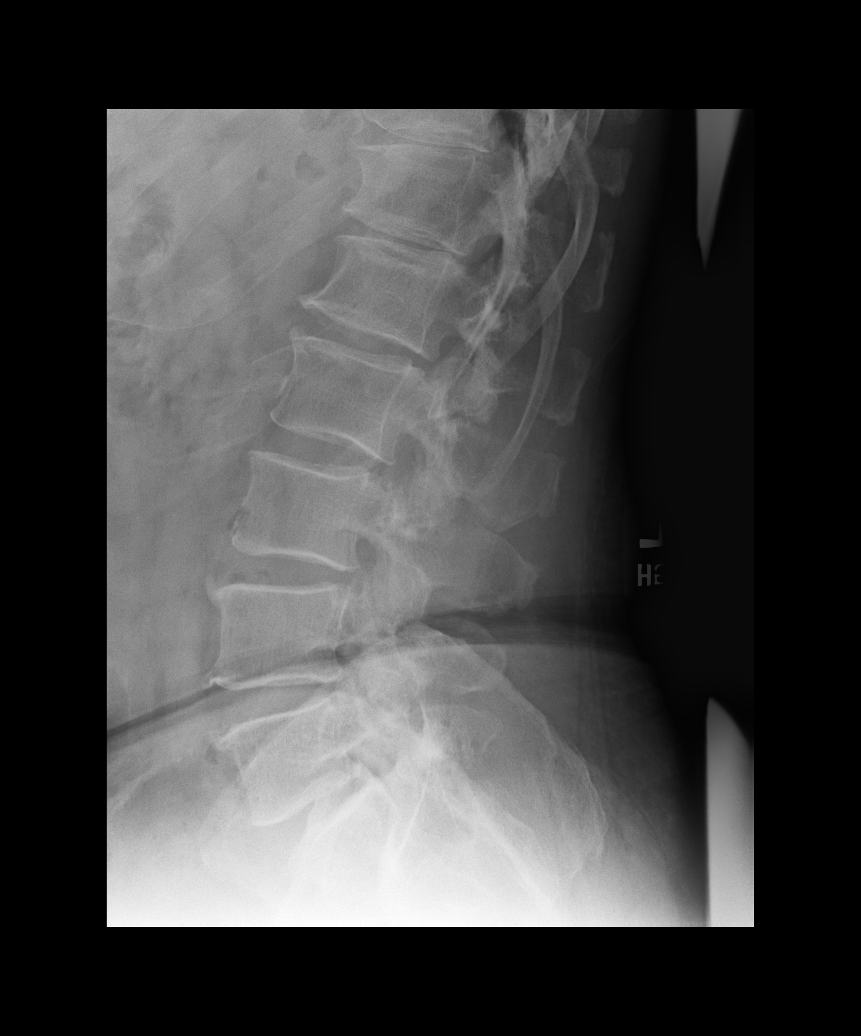

[dg lumbar spine complete 4 +v (5 of 5)]
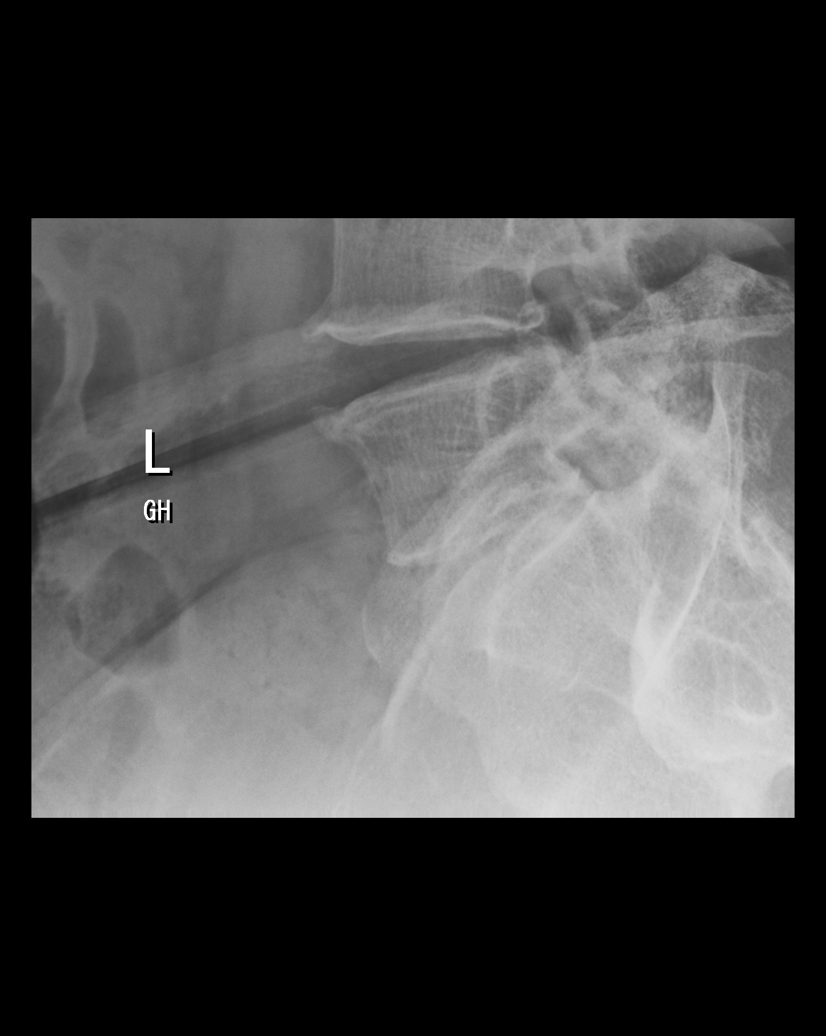

[5 of 5 positions shown; findings below may reference images not displayed]

FINDINGS: Frontal, bilateral oblique, lateral views of the lumbar spine are
obtained. There are 5 non-rib-bearing lumbar type vertebral bodies
in grossly normal alignment. No acute displaced fractures. There is
multilevel spondylosis greatest at T12/L1, L1/L2, and L5/S1.
Significant multilevel facet hypertrophy greatest at L4-5 and L5-S1.
Sacroiliac joints are normal.
IMPRESSION: 1. Multilevel spondylosis and facet hypertrophy.  No acute fracture.

## 2021-12-18 ENCOUNTER — Telehealth: Payer: Self-pay

## 2021-12-18 NOTE — Telephone Encounter (Signed)
Received phone call from Letha, NP with [facility] regarding abnormal PAD screening. Reports that left leg showed mild abnormality. Report is being faxed over to our office.   Called patient. Patient does not want to schedule appointment at this time.   Patient states that he will schedule follow up if recommended by PCP.   Please advise.   Veronda Prude, RN

## 2021-12-21 NOTE — Telephone Encounter (Signed)
Scheduled patient follow up appointment for 12/24/21 at 2:30.  Talbot Grumbling, RN

## 2021-12-24 ENCOUNTER — Other Ambulatory Visit: Payer: Self-pay

## 2021-12-24 ENCOUNTER — Encounter: Payer: Self-pay | Admitting: Family Medicine

## 2021-12-24 ENCOUNTER — Ambulatory Visit (INDEPENDENT_AMBULATORY_CARE_PROVIDER_SITE_OTHER): Payer: Medicare Other | Admitting: Family Medicine

## 2021-12-24 VITALS — BP 118/72 | HR 84 | Wt 218.8 lb

## 2021-12-24 DIAGNOSIS — E782 Mixed hyperlipidemia: Secondary | ICD-10-CM

## 2021-12-24 DIAGNOSIS — I10 Essential (primary) hypertension: Secondary | ICD-10-CM | POA: Diagnosis not present

## 2021-12-24 DIAGNOSIS — I739 Peripheral vascular disease, unspecified: Secondary | ICD-10-CM | POA: Diagnosis not present

## 2021-12-24 DIAGNOSIS — M25512 Pain in left shoulder: Secondary | ICD-10-CM

## 2021-12-24 NOTE — Progress Notes (Signed)
SUBJECTIVE:   CHIEF COMPLAINT / HPI:   Abnormal circulation screening Received a home health screening and was told his PAD screening was abnormal. He denies leg pain at rest or with activity. He has not noticed any skin changes or wound that are unable to heal. Does have a history of ankle fracture.   Left shoulder pain 1 month ago began with exercising. He describes it as an ache and radiates into his neck. He has noticed if he backs off some of his exercises the pain self resolves. He denies history of shoulder trauma. Did seem to have some "catching" around nov/October. Works out several times a week.   Hypertension - Medications: Lisinopril 10 mg daily - Compliance: Yes - Denies any SOB, CP, vision changes - Exercise: Got to gym several times a week  PERTINENT  PMH / PSH: HLD, OA  OBJECTIVE:   BP 118/72    Pulse 84    Wt 218 lb 12.8 oz (99.2 kg)    SpO2 98%    BMI 34.27 kg/m   Physical Exam Vitals reviewed.  Constitutional:      General: He is not in acute distress.    Appearance: Normal appearance. He is not ill-appearing, toxic-appearing or diaphoretic.  Cardiovascular:     Rate and Rhythm: Normal rate and regular rhythm.     Pulses: Normal pulses.     Heart sounds: Normal heart sounds.  Pulmonary:     Effort: Pulmonary effort is normal.     Breath sounds: Normal breath sounds.  Musculoskeletal:     Right lower leg: No tenderness. Edema present.     Left lower leg: No tenderness. Edema present.     Right foot: Normal pulse.     Left foot: Normal pulse.     Comments: No swelling, ecchymoses.  No gross deformity. No TTP. FROM. Negative [place], [place]. Strength 5/5 with empty can and resisted internal/external rotation. Negative apprehension. NV intact distally.   Skin:    General: Skin is warm.     Findings: No bruising, erythema, lesion or rash.  Neurological:     Mental Status: He is alert and oriented to person, place, and time.  Psychiatric:         Mood and Affect: Mood normal.        Behavior: Behavior normal.   ASSESSMENT/PLAN:   1. PAD (peripheral artery disease) (HCC) Abnormal home health screening. Right leg 1.18, left leg 0.80 (indicating mild PAD). Asymptomatic. No evidence of skin changes and normal pulses. Repeat lipid panel and consider increasing Lipitor to 80 mg daily. Will refer to vascular surgery for further investigation if needed.  - Lipid Panel - Ambulatory referral to Vascular Surgery  2. Mixed hyperlipidemia - f/u Lipid Panel - increase lipitor if needed as above.   3. Left shoulder pain, unspecified chronicity 1 month ago with exercises. More chronically with catching several months ago. Improvement with decreasing intensity of workout. Could consider impingement or rotator cuff syndrome. Shoulder exam unremarkable with well preserved strength. We discussed continuing to rest and activity as tolerated.  Heat before activity and ice after.  An oral NSAID such as ibuprofen or Aleve as needed for a short period of time.  If desired, use topical Voltaren gel.  If this continues the issue plan to follow-up and discuss an exercise program to strengthen muscles or physical therapy.  4. Hypertension, unspecified type Initially elevated upon arrival. Repeat normotensive. Continue current medications. Continue to monitor at subsequent visits.  Gerlene Fee, [place]

## 2021-12-24 NOTE — Patient Instructions (Addendum)
Thank you for coming in today. We discussed the results of your circulation screening which indicated peripheral artery disease.  I will refer you to vascular surgery for further work-up if needed.  Today we will check your cholesterol to see if we need to increase your statin medication. We discussed your left shoulder pain.  Your exam was normal today.  We discussed continuing to rest and activity as tolerated.  Heat before activity and ice after.  You can use an oral NSAID such as ibuprofen or Aleve as needed for a short period of time.  If you desire something topical consider Voltaren gel.  If this continues the issue please follow-up and we can discuss an exercise program to strengthen muscles or physical therapy. Your blood pressure was elevated today upon recheck it was normal continue your current medications.

## 2021-12-25 LAB — LIPID PANEL
Chol/HDL Ratio: 3 ratio (ref 0.0–5.0)
Cholesterol, Total: 131 mg/dL (ref 100–199)
HDL: 44 mg/dL (ref >39)
LDL Chol Calc (NIH): 68 mg/dL (ref 0–99)
Triglycerides: 102 mg/dL (ref 0–149)
VLDL Cholesterol Cal: 19 mg/dL (ref 5–40)

## 2022-01-14 ENCOUNTER — Other Ambulatory Visit: Payer: Self-pay

## 2022-01-14 DIAGNOSIS — I739 Peripheral vascular disease, unspecified: Secondary | ICD-10-CM

## 2022-01-15 ENCOUNTER — Encounter: Payer: Self-pay | Admitting: Surgery

## 2022-01-15 ENCOUNTER — Ambulatory Visit (HOSPITAL_COMMUNITY)
Admission: RE | Admit: 2022-01-15 | Discharge: 2022-01-15 | Disposition: A | Discharge status: Home / Self Care | Payer: Medicare Other | Source: Ambulatory Visit | Attending: Surgery | Admitting: Surgery

## 2022-01-15 ENCOUNTER — Encounter (HOSPITAL_COMMUNITY): Payer: Medicare Other

## 2022-01-15 ENCOUNTER — Encounter: Payer: Medicare Other | Admitting: Surgery

## 2022-01-15 ENCOUNTER — Ambulatory Visit: Payer: Medicare Other | Admitting: Surgery

## 2022-01-15 ENCOUNTER — Other Ambulatory Visit: Payer: Self-pay

## 2022-01-15 VITALS — BP 131/81 | HR 72 | Temp 98.3°F | Resp 20 | Ht 67.0 in | Wt 218.0 lb

## 2022-01-15 DIAGNOSIS — I739 Peripheral vascular disease, unspecified: Secondary | ICD-10-CM

## 2022-01-15 NOTE — Progress Notes (Signed)
? ?[facility] ? ?Patient name: Elijah Washington. Jungbluth MRN: [id number] DOB: 09/09/1956 Sex: male ? ? ?REQUESTING PROVIDER:  ? ? Terisa Starr, M.D. ? ? ?REASON FOR CONSULT:  ?  ?PAD ? ?HISTORY OF PRESENT ILLNESS:  ? ?Elijah Washington is a 66 y.o. male, who is referred for evaluation of peripheral vascular disease.  He underwent an insurance screening which revealed abnormal findings on the left.  He is here today for further evaluation.  He denies any symptoms of claudication.  He denies rest pain.  He denies nonhealing wounds.  He does state that he had a injury back in high school in his left ankle requiring hardware.  Since that time his left foot has not been the same.  There is no family history of peripheral vascular disease. ? ?He is medically managed for hypertension with an ACE inhibitor.  He takes a statin for hypercholesterolemia.  He is a never smoker. ? ?PAST MEDICAL HISTORY  ? ? ?Past Medical History:  ?Diagnosis Date  ? Hypertension   ? ? ? ?FAMILY HISTORY  ? ?Family History  ?Problem Relation Age of Onset  ? Heart failure Mother   ? Cancer Father   ? ? ?SOCIAL HISTORY:  ? ?Social History  ? ?Socioeconomic History  ? Marital status: Single  ?  Spouse name: Not on file  ? Number of children: Not on file  ? Years of education: Not on file  ? Highest education level: Not on file  ?Occupational History  ? Not on file  ?Tobacco Use  ? Smoking status: Never  ? Smokeless tobacco: Never  ?Substance and Sexual Activity  ? Alcohol use: Not on file  ? Drug use: Not on file  ? Sexual activity: Not on file  ?Other Topics Concern  ? Not on file  ?Social History Narrative  ? Not on file  ? ?Social Determinants of Health  ? ?Financial Resource Strain: Not on file  ?Food Insecurity: Not on file  ?Transportation Needs: Not on file  ?Physical Activity: Not on file  ?Stress: Not on file  ?Social Connections: Not on file  ?Intimate Partner Violence: Not on file   ? ? ?ALLERGIES:  ? ? ?No Known Allergies ? ?CURRENT MEDICATIONS:  ? ? ?Current Outpatient Medications  ?Medication Sig Dispense Refill  ? atorvastatin (LIPITOR) 40 MG tablet Take 1 tablet (40 mg total) by mouth daily. 90 tablet 3  ? lisinopril (ZESTRIL) 10 MG tablet Take 1 tablet (10 mg total) by mouth daily. 90 tablet 0  ? PARoxetine (PAXIL) 20 MG tablet Take 1 tablet (20 mg total) by mouth daily. 90 tablet 0  ? sildenafil (VIAGRA) 25 MG tablet Take 25 mg by mouth daily as needed for erectile dysfunction.    ? ?No current facility-administered medications for this visit.  ? ? ?REVIEW OF SYSTEMS:  ? ?[X]  denotes positive finding, [ ]  denotes negative finding ?Cardiac  Comments:  ?Chest pain or chest pressure:    ?Shortness of breath upon exertion:    ?Short of breath when lying flat:    ?Irregular heart rhythm:    ?    ?Vascular    ?Pain in calf, thigh, or hip brought on by ambulation:    ?Pain in feet at night that wakes you up from your sleep:     ?Blood clot in your veins:    ?Leg swelling:     ?    ?Pulmonary    ?Oxygen at home:    ?  Productive cough:     ?Wheezing:     ?    ?Neurologic    ?Sudden weakness in arms or legs:     ?Sudden numbness in arms or legs:     ?Sudden onset of difficulty speaking or slurred speech:    ?Temporary loss of vision in one eye:     ?Problems with dizziness:     ?    ?Gastrointestinal    ?Blood in stool:     ? ?Vomited blood:     ?    ?Genitourinary    ?Burning when urinating:     ?Blood in urine:    ?    ?Psychiatric    ?Major depression:     ?    ?Hematologic    ?Bleeding problems:    ?Problems with blood clotting too easily:    ?    ?Skin    ?Rashes or ulcers:    ?    ?Constitutional    ?Fever or chills:    ? ?PHYSICAL EXAM:  ? ?There were no vitals filed for this visit. ? ?GENERAL: The patient is a well-nourished male, in no acute distress. The vital signs are documented above. ?CARDIAC: There is a regular rate and rhythm.  ?VASCULAR: Palpable posterior tibial pulses  bilaterally ?PULMONARY: Nonlabored respirations ?MUSCULOSKELETAL: There are no major deformities or cyanosis. ?NEUROLOGIC: No focal weakness or paresthesias are detected. ?SKIN: There are no ulcers or rashes noted. ?PSYCHIATRIC: The patient has a normal affect. ? ?STUDIES:  ? ?I have reviewed the following studies: ?ABI/TBIToday's ABIToday's TBIPrevious ABIPrevious TBI  ?+-------+-----------+-----------+------------+------------+  ?Right  [place]         1.07       1.18                      ?+-------+-----------+-----------+------------+------------+  ?Left   [place]         0.67       0.80                      ?+-------+-----------+-----------+------------+------------+ ?Right toe pressure equals 40 ?Left toe pressure equals 88 ?All waveforms are triphasic ? ?ASSESSMENT and PLAN  ? ?PAD: The patient has palpable pedal pulses.  He has triphasic waveforms on Doppler evaluation.  His vessels were noncompressible and so ABIs could not be calculated.  His toe pressure was appropriate on the right and slightly decreased on the left, however this may have something to do with his prior orthopedic surgeries on that foot.  No further evaluation is necessary at this time.  He is already on blood pressure medication and statin with a recent LDL of 68.  The only addition to his medication profile would be a baby aspirin, at the discretion of Dr. Manson Passey.  He is going to follow-up with me as needed. ? ? ?Durene Cal, IV, MD, FACS ?[facility] ?Tel [phone] ?Pager [phone]  ?

## 2022-01-19 ENCOUNTER — Other Ambulatory Visit: Payer: Self-pay | Admitting: Family Medicine

## 2022-01-19 ENCOUNTER — Other Ambulatory Visit: Payer: Self-pay | Admitting: Student in an Organized Health Care Education/Training Program

## 2022-01-19 DIAGNOSIS — F419 Anxiety disorder, unspecified: Secondary | ICD-10-CM

## 2022-01-22 ENCOUNTER — Other Ambulatory Visit: Payer: Self-pay | Admitting: Family Medicine

## 2022-01-22 DIAGNOSIS — I10 Essential (primary) hypertension: Secondary | ICD-10-CM

## 2022-04-13 ENCOUNTER — Encounter: Payer: Self-pay | Admitting: *Deleted

## 2022-04-19 ENCOUNTER — Other Ambulatory Visit: Payer: Self-pay | Admitting: Family Medicine

## 2022-04-19 DIAGNOSIS — I10 Essential (primary) hypertension: Secondary | ICD-10-CM

## 2022-04-19 DIAGNOSIS — F419 Anxiety disorder, unspecified: Secondary | ICD-10-CM

## 2022-07-17 ENCOUNTER — Other Ambulatory Visit: Payer: Self-pay | Admitting: Family Medicine

## 2022-07-17 DIAGNOSIS — I10 Essential (primary) hypertension: Secondary | ICD-10-CM

## 2022-07-17 DIAGNOSIS — F32A Depression, unspecified: Secondary | ICD-10-CM

## 2022-07-19 ENCOUNTER — Other Ambulatory Visit: Payer: Self-pay | Admitting: Family Medicine

## 2022-07-19 DIAGNOSIS — I10 Essential (primary) hypertension: Secondary | ICD-10-CM

## 2022-07-19 DIAGNOSIS — F419 Anxiety disorder, unspecified: Secondary | ICD-10-CM

## 2022-07-21 MED ORDER — LISINOPRIL 10 MG PO TABS: 10.0000 mg | ORAL_TABLET | Freq: Every day | ORAL | 0 refills | Status: DC

## 2022-07-21 MED ORDER — PAROXETINE HCL 20 MG PO TABS: 20.0000 mg | ORAL_TABLET | Freq: Every day | ORAL | 0 refills | Status: DC

## 2022-10-16 ENCOUNTER — Other Ambulatory Visit: Payer: Self-pay | Admitting: Student

## 2022-10-16 DIAGNOSIS — F419 Anxiety disorder, unspecified: Secondary | ICD-10-CM

## 2022-10-16 DIAGNOSIS — I10 Essential (primary) hypertension: Secondary | ICD-10-CM

## 2022-12-16 ENCOUNTER — Encounter: Payer: Self-pay | Admitting: Family Medicine

## 2022-12-16 ENCOUNTER — Ambulatory Visit (INDEPENDENT_AMBULATORY_CARE_PROVIDER_SITE_OTHER): Payer: Medicare Other | Admitting: Family Medicine

## 2022-12-16 VITALS — BP 134/73 | HR 65 | Ht 67.0 in | Wt 219.2 lb

## 2022-12-16 DIAGNOSIS — L989 Disorder of the skin and subcutaneous tissue, unspecified: Secondary | ICD-10-CM | POA: Insufficient documentation

## 2022-12-16 DIAGNOSIS — I10 Essential (primary) hypertension: Secondary | ICD-10-CM

## 2022-12-16 DIAGNOSIS — E782 Mixed hyperlipidemia: Secondary | ICD-10-CM

## 2022-12-16 HISTORY — DX: Disorder of the skin and subcutaneous tissue, unspecified: L98.9

## 2022-12-16 MED ORDER — TERBINAFINE HCL 1 % EX CREA: 1.0000 | TOPICAL_CREAM | Freq: Every day | CUTANEOUS | 0 refills | Status: DC

## 2022-12-16 MED ORDER — TETANUS-DIPHTH-ACELL PERTUSSIS 5-2.5-18.5 LF-MCG/0.5 IM SUSP: 0.5000 mL | Freq: Once | INTRAMUSCULAR | 0 refills | Status: AC

## 2022-12-16 NOTE — Assessment & Plan Note (Signed)
Blood pressure overall controlled today.  He is taking his medications as prescribed.  Has not had updated metabolic panel in over a year, will get updated CMP today.

## 2022-12-16 NOTE — Assessment & Plan Note (Signed)
Unknown etiology, differential broad.  Given recurrence in same area with pustular appearance underlying erythema consider herpes simplex.  Will collect swabs for PCR.  Location of rash could also be pustular tinea, will prescribe terbinafine 1% cream daily and assess response.  Less likely shingles (given he is vaccinated and without pain) and contact dermatitis (given location of lesions and without known exposures).  Will follow-up lab results.

## 2022-12-16 NOTE — Patient Instructions (Signed)
It was great to see you today! Here's what we talked about:  I will follow up culture swab for you. I have sent in antifungal cream to place on the area. Let me know if this does not help. I will follow up results of your lab work. Take the tetanus shot prescription to your pharmacy to obtain it.  Please let me know if you have any other questions.  Dr. Marcha Dutton

## 2022-12-16 NOTE — Progress Notes (Cosign Needed Addendum)
    SUBJECTIVE:   CHIEF COMPLAINT / HPI:   Rash on right side Began about a week ago in his right inguinal area.  The lesions came about without any precipitant.  The rash is itchy but not painful.  He has at this before about 10 years ago in the same distribution, the rash may have been larger and more red at that time.  He was treated for this, but he is unsure of what was done.  He denies any new exposures or sexual partners.  He is a pretty active guy but takes showers daily.   PERTINENT  PMH / PSH: HLD, OA, HTN, PAD without further evaluation by [facility]  OBJECTIVE:   BP 134/73   Pulse 65   Ht 5\' 7"  (1.702 m)   Wt 219 lb 3.2 oz (99.4 kg)   SpO2 100%   BMI 34.33 kg/m   General: Alert and oriented, in NAD Skin: Warm, dry, and intact; near-linear pustular lesions with underlying erythema in right inguinal crease without pain to palpation or visible drainage or bleeding HEENT: NCAT, EOM grossly normal, midline nasal septum Respiratory: Breathing and speaking comfortably on RA Extremities: Moves all extremities grossly equally Neurological: No gross focal deficit Psychiatric: Appropriate mood and affect    ASSESSMENT/PLAN:   Skin lesion Unknown etiology, differential broad.  Given recurrence in same area with pustular appearance underlying erythema consider herpes simplex.  Will collect swabs for PCR.  Location of rash could also be pustular tinea, will prescribe terbinafine 1% cream daily and assess response.  Less likely shingles (given he is vaccinated and without pain) and contact dermatitis (given location of lesions and without known exposures).  Will follow-up lab results.  ADDENDUM 12/17/22: Called patient to discuss inability to run HSV PCR on previous swab. Patient is amenable to returning for no charge visit to reswab given our error. I appreciate his understanding.  Health maintenance Tetanus booster printed Rx given. Shingrix shot 2/2 completed earlier this month.  Colonoscopy due in 2026 per previous records.  Ethelene Hal, MD [place]

## 2022-12-16 NOTE — Assessment & Plan Note (Signed)
Last lipid check a year ago normal.  He has been taking his statin as prescribed.  Will get updated lipid panel today.

## 2022-12-17 LAB — COMPREHENSIVE METABOLIC PANEL
ALT: 38 IU/L (ref 0–44)
AST: 34 IU/L (ref 0–40)
Albumin/Globulin Ratio: 2 (ref 1.2–2.2)
Albumin: 4.4 g/dL (ref 3.9–4.9)
Alkaline Phosphatase: 80 IU/L (ref 44–121)
BUN/Creatinine Ratio: 13 (ref 10–24)
BUN: 14 mg/dL (ref 8–27)
Bilirubin Total: 0.9 mg/dL (ref 0.0–1.2)
CO2: 23 mmol/L (ref 20–29)
Calcium: 9.4 mg/dL (ref 8.6–10.2)
Chloride: 105 mmol/L (ref 96–106)
Creatinine, Ser: 1.07 mg/dL (ref 0.76–1.27)
Globulin, Total: 2.2 g/dL (ref 1.5–4.5)
Glucose: 93 mg/dL (ref 70–99)
Potassium: 4.9 mmol/L (ref 3.5–5.2)
Sodium: 142 mmol/L (ref 134–144)
Total Protein: 6.6 g/dL (ref 6.0–8.5)
eGFR: 76 mL/min/{1.73_m2} (ref >59)

## 2022-12-17 LAB — LIPID PANEL
Chol/HDL Ratio: 2.8 ratio (ref 0.0–5.0)
Cholesterol, Total: 138 mg/dL (ref 100–199)
HDL: 50 mg/dL (ref >39)
LDL Chol Calc (NIH): 72 mg/dL (ref 0–99)
Triglycerides: 81 mg/dL (ref 0–149)
VLDL Cholesterol Cal: 16 mg/dL (ref 5–40)

## 2022-12-20 ENCOUNTER — Encounter: Payer: Self-pay | Admitting: Family Medicine

## 2022-12-20 ENCOUNTER — Ambulatory Visit (INDEPENDENT_AMBULATORY_CARE_PROVIDER_SITE_OTHER): Payer: Medicare Other | Admitting: Family Medicine

## 2022-12-20 VITALS — BP 118/68 | HR 74 | Ht 67.0 in | Wt 219.0 lb

## 2022-12-20 DIAGNOSIS — B359 Dermatophytosis, unspecified: Secondary | ICD-10-CM

## 2022-12-20 NOTE — Patient Instructions (Addendum)
It was great to see you today! Here's what we talked about:  I am very happy to see the rash is improving with the cream. Continue using this until you see full resolution. Because it looks so good, there is no purulence to test today, though I believe this was likely a fungal infection.  Please let me know if you have any other questions.  Dr. Marcha Dutton

## 2022-12-20 NOTE — Progress Notes (Signed)
    SUBJECTIVE:   CHIEF COMPLAINT / HPI:   Reswab skin lesion Lesion has improved greatly with terbinafine cream daily. There are no longer any lesions with pus.  OBJECTIVE:   BP 118/68   Pulse 74   Ht 5\' 7"  (1.702 m)   Wt 219 lb (99.3 kg)   SpO2 98%   BMI 34.30 kg/m   General: Alert and oriented, in NAD Skin: Warm, dry; healing pustular lesions with light erythematous base in R inguinal crease HEENT: NCAT, EOM grossly normal, midline nasal septum Respiratory: Breathing and speaking comfortably on RA Extremities: Moves all extremities grossly equally Neurological: No gross focal deficit Psychiatric: Appropriate mood and affect     ASSESSMENT/PLAN:   Pustular tinea Given marked improvement with terbinafine cream, lesions likely due to tinea. This is consistent with the location of the lesions where warmth and moisture is common. Recommended continuing the cream until lesions have completely resolved. Did not perform repeat HSV swabs today given there are no lesions left. Follow up as needed.   Ethelene Hal, MD [place]

## 2023-01-14 ENCOUNTER — Other Ambulatory Visit: Payer: Self-pay | Admitting: Student

## 2023-01-14 ENCOUNTER — Other Ambulatory Visit: Payer: Self-pay | Admitting: Family Medicine

## 2023-01-14 DIAGNOSIS — I10 Essential (primary) hypertension: Secondary | ICD-10-CM

## 2023-01-14 DIAGNOSIS — F32A Depression, unspecified: Secondary | ICD-10-CM

## 2023-04-12 ENCOUNTER — Other Ambulatory Visit: Payer: Self-pay | Admitting: Student

## 2023-04-12 DIAGNOSIS — I10 Essential (primary) hypertension: Secondary | ICD-10-CM

## 2023-04-12 DIAGNOSIS — F419 Anxiety disorder, unspecified: Secondary | ICD-10-CM

## 2023-04-24 NOTE — Progress Notes (Deleted)
Subjective:   Elijah Washington is a 67 y.o. male who presents for an Initial Medicare Annual Wellness Visit.  Review of Systems    ***       Objective:    There were no vitals filed for this visit. There is no height or weight on file to calculate BMI.     12/20/2022    9:25 AM 12/16/2022    8:53 AM 12/24/2021    2:28 PM 01/30/2021    1:46 PM  Advanced Directives  Does Patient Have a Medical Advance Directive? No No No No  Would patient like information on creating a medical advance directive? No - Patient declined No - Patient declined No - Patient declined No - Patient declined    Current Medications (verified) Outpatient Encounter Medications as of 04/25/2023  Medication Sig   atorvastatin (LIPITOR) 40 MG tablet TAKE 1 TABLET BY MOUTH EVERY DAY   lisinopril (ZESTRIL) 10 MG tablet TAKE 1 TABLET BY MOUTH EVERY DAY   PARoxetine (PAXIL) 20 MG tablet TAKE 1 TABLET BY MOUTH EVERY DAY   sildenafil (VIAGRA) 25 MG tablet Take 25 mg by mouth daily as needed for erectile dysfunction.   terbinafine (LAMISIL) 1 % cream Apply 1 Application topically daily. Apply daily until resolution of rash.   No facility-administered encounter medications on file as of 04/25/2023.    Allergies (verified) Patient has no known allergies.   History: Past Medical History:  Diagnosis Date   Hypertension    Past Surgical History:  Procedure Laterality Date   ANKLE FRACTURE SURGERY     Family History  Problem Relation Age of Onset   Heart failure Mother    Cancer Father    Social History   Socioeconomic History   Marital status: Single    Spouse name: Not on file   Number of children: Not on file   Years of education: Not on file   Highest education level: Not on file  Occupational History   Not on file  Tobacco Use   Smoking status: Never   Smokeless tobacco: Never  Substance and Sexual Activity   Alcohol use: Not on file   Drug use: Not on file   Sexual activity: Not on file   Other Topics Concern   Not on file  Social History Narrative   Not on file   Social Determinants of Health   Financial Resource Strain: Not on file  Food Insecurity: Not on file  Transportation Needs: Not on file  Physical Activity: Not on file  Stress: Not on file  Social Connections: Not on file    Tobacco Counseling Counseling given: Not Answered   Clinical Intake:              How often do you need to have someone help you when you read instructions, pamphlets, or other written materials from your doctor or pharmacy?: (P) 1 - Never  Diabetic?No          Activities of Daily Living    04/22/2023    4:19 PM  In your present state of health, do you have any difficulty performing the following activities:  Hearing? 1  Vision? 0  Difficulty concentrating or making decisions? 0  Walking or climbing stairs? 0  Dressing or bathing? 0  Doing errands, shopping? 0  Preparing Food and eating ? N  Using the Toilet? N  In the past six months, have you accidently leaked urine? N  Do you have problems with loss  of bowel control? N  Managing your Medications? N  Managing your Finances? N  Housekeeping or managing your Housekeeping? N    Patient Care Team: Alicia Amel, MD as PCP - General (Family Medicine)  Indicate any recent Medical Services you may have received from other than [facility] providers in the past year (date may be approximate).     Assessment:   This is a routine wellness examination for [place].  Hearing/Vision screen No results found.  Dietary issues and exercise activities discussed:     Goals Addressed   None    Depression Screen    12/20/2022    9:25 AM 12/16/2022    8:51 AM 12/24/2021    2:28 PM 10/08/2021    1:47 PM 01/30/2021    1:46 PM 08/05/2020    9:25 AM  PHQ 2/9 Scores  PHQ - 2 Score 0 0 0 0 0 0  PHQ- 9 Score 0 0 0 0 0 0    Fall Risk    04/22/2023    4:19 PM 12/16/2022    8:51 AM 12/24/2021    2:27 PM 01/30/2021     1:46 PM  Fall Risk   Falls in the past year? 0 0 0 0  Number falls in past yr:  0 0 0  Injury with Fall?  0 0 0    FALL RISK PREVENTION PERTAINING TO THE HOME:  Any stairs in or around the home? {YES/NO:21197} If so, are there any without handrails? {YES/NO:21197} Home free of loose throw rugs in walkways, pet beds, electrical cords, etc? {YES/NO:21197} Adequate lighting in your home to reduce risk of falls? {YES/NO:21197}  ASSISTIVE DEVICES UTILIZED TO PREVENT FALLS:  Life alert? {YES/NO:21197} Use of a cane, walker or w/c? {YES/NO:21197} Grab bars in the bathroom? {YES/NO:21197} Shower chair or bench in shower? {YES/NO:21197} Elevated toilet seat or a handicapped toilet? {YES/NO:21197}  TIMED UP AND GO:  Was the test performed? No . Telephonic visit   Cognitive Function:        Immunizations Immunization History  Administered Date(s) Administered   COVID-19, mRNA, vaccine(Comirnaty)12 years and older 09/23/2022   Fluad Quad(high Dose 65+) 08/05/2020, 10/08/2021   Influenza-Unspecified 09/23/2022   PFIZER(Purple Top)SARS-COV-2 Vaccination 01/31/2020, 02/25/2020, 09/30/2020   PNEUMOCOCCAL CONJUGATE-20 01/30/2021   Pfizer Covid-19 Vaccine Bivalent Booster 29yrs & up 10/08/2021   Zoster Recombinat (Shingrix) 09/23/2022, 12/14/2022    TDAP status: Due, Education has been provided regarding the importance of this vaccine. Advised may receive this vaccine at local pharmacy or Health Dept. Aware to provide a copy of the vaccination record if obtained from local pharmacy or Health Dept. Verbalized acceptance and understanding.  Pneumococcal vaccine status: Up to date  Covid-19 vaccine status: Information provided on how to obtain vaccines.   Qualifies for Shingles Vaccine? Yes   Zostavax completed No   Shingrix Completed?: Yes  Screening Tests Health Maintenance  Topic Date Due   Medicare Annual Wellness (AWV)  Never done   DTaP/Tdap/Td (1 - Tdap) Never done    INFLUENZA VACCINE  06/09/2023   Colonoscopy  11/08/2024   Pneumonia Vaccine 20+ Years old  Completed   COVID-19 Vaccine  Completed   Hepatitis C Screening  Completed   Zoster Vaccines- Shingrix  Completed   HPV VACCINES  Aged Out    Health Maintenance  Health Maintenance Due  Topic Date Due   Medicare Annual Wellness (AWV)  Never done   DTaP/Tdap/Td (1 - Tdap) Never done    Colorectal cancer screening:  Type of screening: Colonoscopy. Completed 11/08/14. Repeat every 10 years  Lung Cancer Screening: (Low Dose CT Chest recommended if Age 81-80 years, 30 pack-year currently smoking OR have quit w/in 15years.) does not qualify.   Lung Cancer Screening Referral: n/a  Additional Screening:  Hepatitis C Screening: does qualify; Completed 10/08/21  Vision Screening: Recommended annual ophthalmology exams for early detection of glaucoma and other disorders of the eye. Is the patient up to date with their annual eye exam?  {YES/NO:21197} Who is the provider or what is the name of the office in which the patient attends annual eye exams? *** If pt is not established with a provider, would they like to be referred to a provider to establish care? {YES/NO:21197}.   Dental Screening: Recommended annual dental exams for proper oral hygiene  Community Resource Referral / Chronic Care Management: CRR required this visit?  {YES/NO:21197}  CCM required this visit?  {YES/NO:21197}     Plan:     I have personally reviewed and noted the following in the patient's chart:   Medical and social history Use of alcohol, tobacco or illicit drugs  Current medications and supplements including opioid prescriptions. {Opioid Prescriptions:8176118279} Functional ability and status Nutritional status Physical activity Advanced directives List of other physicians Hospitalizations, surgeries, and ER visits in previous 12 months Vitals Screenings to include cognitive, depression, and falls Referrals  and appointments  In addition, I have reviewed and discussed with patient certain preventive protocols, quality metrics, and best practice recommendations. A written personalized care plan for preventive services as well as general preventive health recommendations were provided to patient.     Durwin Nora, [place]   1/61/0960   Due to this being a virtual visit, the after visit summary with patients personalized plan was offered to patient via mail or my-chart. ***Patient declined at this time./ Patient would like to access on my-chart/ per request, patient was mailed a copy of AVS./ Patient preferred to pick up at office at next visit  Nurse Notes: ***

## 2023-04-24 NOTE — Patient Instructions (Incomplete)
Elijah Washington , Thank you for taking time to come for your Medicare Wellness Visit. I appreciate your ongoing commitment to your health goals. Please review the following plan we discussed and let me know if I can assist you in the future.   These are the goals we discussed:  Goals   None     This is a list of the screening recommended for you and due dates:  Health Maintenance  Topic Date Due   Medicare Annual Wellness Visit  Never done   DTaP/Tdap/Td vaccine (1 - Tdap) Never done   Flu Shot  06/09/2023   Colon Cancer Screening  11/08/2024   Pneumonia Vaccine  Completed   COVID-19 Vaccine  Completed   Hepatitis C Screening  Completed   Zoster (Shingles) Vaccine  Completed   HPV Vaccine  Aged Out    Advanced directives: Information on Advanced Care Planning can be found at [facility] of [facility] Advance Health Care Directives Advance Health Care Directives ([email or web address] Please bring a copy of your health care power of attorney and living will to the office to be added to your chart at your convenience.  Conditions/risks identified: Aim for 30 minutes of exercise or brisk walking, 6-8 glasses of water, and 5 servings of fruits and vegetables each day.  Next appointment: Follow up in one year for your annual wellness visit.   Preventive Care 69 Years and Older, Male  Preventive care refers to lifestyle choices and visits with your health care provider that can promote health and wellness. What does preventive care include? A yearly physical exam. This is also called an annual well check. Dental exams once or twice a year. Routine eye exams. Ask your health care provider how often you should have your eyes checked. Personal lifestyle choices, including: Daily care of your teeth and gums. Regular physical activity. Eating a healthy diet. Avoiding tobacco and drug use. Limiting alcohol use. Practicing safe sex. Taking low doses of aspirin every day. Taking vitamin and  mineral supplements as recommended by your health care provider. What happens during an annual well check? The services and screenings done by your health care provider during your annual well check will depend on your age, overall health, lifestyle risk factors, and family history of disease. Counseling  Your health care provider may ask you questions about your: Alcohol use. Tobacco use. Drug use. Emotional well-being. Home and relationship well-being. Sexual activity. Eating habits. History of falls. Memory and ability to understand (cognition). Work and work Astronomer. Screening  You may have the following tests or measurements: Height, weight, and BMI. Blood pressure. Lipid and cholesterol levels. These may be checked every 5 years, or more frequently if you are over 69 years old. Skin check. Lung cancer screening. You may have this screening every year starting at age 69 if you have a 30-pack-year history of smoking and currently smoke or have quit within the past 15 years. Fecal occult blood test (FOBT) of the stool. You may have this test every year starting at age 69 Flexible sigmoidoscopy or colonoscopy. You may have a sigmoidoscopy every 5 years or a colonoscopy every 10 years starting at age 69 Prostate cancer screening. Recommendations will vary depending on your family history and other risks. Hepatitis C blood test. Hepatitis B blood test. Sexually transmitted disease (STD) testing. Diabetes screening. This is done by checking your blood sugar (glucose) after you have not eaten for a while (fasting). You may have this done every 1-3 years.  Abdominal aortic aneurysm (AAA) screening. You may need this if you are a current or former smoker. Osteoporosis. You may be screened starting at age 69 if you are at high risk. Talk with your health care provider about your test results, treatment options, and if necessary, the need for more tests. Vaccines  Your health care  provider may recommend certain vaccines, such as: Influenza vaccine. This is recommended every year. Tetanus, diphtheria, and acellular pertussis (Tdap, Td) vaccine. You may need a Td booster every 10 years. Zoster vaccine. You may need this after age 69 Pneumococcal 13-valent conjugate (PCV13) vaccine. One dose is recommended after age 69 Pneumococcal polysaccharide (PPSV23) vaccine. One dose is recommended after age 69 Talk to your health care provider about which screenings and vaccines you need and how often you need them. This information is not intended to replace advice given to you by your health care provider. Make sure you discuss any questions you have with your health care provider. Document Released: 11/21/2015 Document Revised: 07/14/2016 Document Reviewed: 08/26/2015 Elsevier Interactive Patient Education  2017 [employer].  Fall Prevention in the Home Falls can cause injuries. They can happen to people of all ages. There are many things you can do to make your home safe and to help prevent falls. What can I do on the outside of my home? Regularly fix the edges of walkways and driveways and fix any cracks. Remove anything that might make you trip as you walk through a door, such as a raised step or threshold. Trim any bushes or trees on the path to your home. Use bright outdoor lighting. Clear any walking paths of anything that might make someone trip, such as rocks or tools. Regularly check to see if handrails are loose or broken. Make sure that both sides of any steps have handrails. Any raised decks and porches should have guardrails on the edges. Have any leaves, snow, or ice cleared regularly. Use sand or salt on walking paths during winter. Clean up any spills in your garage right away. This includes oil or grease spills. What can I do in the bathroom? Use night lights. Install grab bars by the toilet and in the tub and shower. Do not use towel bars as grab  bars. Use non-skid mats or decals in the tub or shower. If you need to sit down in the shower, use a plastic, non-slip stool. Keep the floor dry. Clean up any water that spills on the floor as soon as it happens. Remove soap buildup in the tub or shower regularly. Attach bath mats securely with double-sided non-slip rug tape. Do not have throw rugs and other things on the floor that can make you trip. What can I do in the bedroom? Use night lights. Make sure that you have a light by your bed that is easy to reach. Do not use any sheets or blankets that are too big for your bed. They should not hang down onto the floor. Have a firm chair that has side arms. You can use this for support while you get dressed. Do not have throw rugs and other things on the floor that can make you trip. What can I do in the kitchen? Clean up any spills right away. Avoid walking on wet floors. Keep items that you use a lot in easy-to-reach places. If you need to reach something above you, use a strong step stool that has a grab bar. Keep electrical cords out of the way. Do not  use floor polish or wax that makes floors slippery. If you must use wax, use non-skid floor wax. Do not have throw rugs and other things on the floor that can make you trip. What can I do with my stairs? Do not leave any items on the stairs. Make sure that there are handrails on both sides of the stairs and use them. Fix handrails that are broken or loose. Make sure that handrails are as long as the stairways. Check any carpeting to make sure that it is firmly attached to the stairs. Fix any carpet that is loose or worn. Avoid having throw rugs at the top or bottom of the stairs. If you do have throw rugs, attach them to the floor with carpet tape. Make sure that you have a light switch at the top of the stairs and the bottom of the stairs. If you do not have them, ask someone to add them for you. What else can I do to help prevent  falls? Wear shoes that: Do not have high heels. Have rubber bottoms. Are comfortable and fit you well. Are closed at the toe. Do not wear sandals. If you use a stepladder: Make sure that it is fully opened. Do not climb a closed stepladder. Make sure that both sides of the stepladder are locked into place. Ask someone to hold it for you, if possible. Clearly mark and make sure that you can see: Any grab bars or handrails. First and last steps. Where the edge of each step is. Use tools that help you move around (mobility aids) if they are needed. These include: Canes. Walkers. Scooters. Crutches. Turn on the lights when you go into a dark area. Replace any light bulbs as soon as they burn out. Set up your furniture so you have a clear path. Avoid moving your furniture around. If any of your floors are uneven, fix them. If there are any pets around you, be aware of where they are. Review your medicines with your doctor. Some medicines can make you feel dizzy. This can increase your chance of falling. Ask your doctor what other things that you can do to help prevent falls. This information is not intended to replace advice given to you by your health care provider. Make sure you discuss any questions you have with your health care provider. Document Released: 08/21/2009 Document Revised: 04/01/2016 Document Reviewed: 11/29/2014 Elsevier Interactive Patient Education  2017 [employer].

## 2023-04-25 ENCOUNTER — Ambulatory Visit (INDEPENDENT_AMBULATORY_CARE_PROVIDER_SITE_OTHER): Payer: Medicare Other

## 2023-04-25 VITALS — Ht 67.0 in | Wt 219.0 lb

## 2023-04-25 DIAGNOSIS — Z0000 Encounter for general adult medical examination without abnormal findings: Secondary | ICD-10-CM

## 2023-04-25 NOTE — Progress Notes (Cosign Needed)
Subjective:   Elijah Washington is a 67 y.o. male who presents for an Initial Medicare Annual Wellness Visit.  I connected with  Elijah Washington on 04/25/23 by a audio enabled telemedicine application and verified that I am speaking with the correct person using two identifiers.  Patient Location: Home  Provider Location: Home Office  I discussed the limitations of evaluation and management by telemedicine. The patient expressed understanding and agreed to proceed.  Review of Systems     Cardiac Risk Factors include: advanced age (>58men, >49 women);male gender;dyslipidemia;hypertension     Objective:    Today's Vitals   04/25/23 0851  Weight: 219 lb (99.3 kg)  Height: 5\' 7"  (1.702 m)   Body mass index is 34.3 kg/m.     04/25/2023    8:55 AM 12/20/2022    9:25 AM 12/16/2022    8:53 AM 12/24/2021    2:28 PM 01/30/2021    1:46 PM  Advanced Directives  Does Patient Have a Medical Advance Directive? No No No No No  Would patient like information on creating a medical advance directive? Yes (MAU/Ambulatory/Procedural Areas - Information given) No - Patient declined No - Patient declined No - Patient declined No - Patient declined    Current Medications (verified) Outpatient Encounter Medications as of 04/25/2023  Medication Sig   atorvastatin (LIPITOR) 40 MG tablet TAKE 1 TABLET BY MOUTH EVERY DAY   lisinopril (ZESTRIL) 10 MG tablet TAKE 1 TABLET BY MOUTH EVERY DAY   PARoxetine (PAXIL) 20 MG tablet TAKE 1 TABLET BY MOUTH EVERY DAY   sildenafil (VIAGRA) 25 MG tablet Take 25 mg by mouth daily as needed for erectile dysfunction.   [DISCONTINUED] terbinafine (LAMISIL) 1 % cream Apply 1 Application topically daily. Apply daily until resolution of rash.   No facility-administered encounter medications on file as of 04/25/2023.    Allergies (verified) Patient has no known allergies.   History: Past Medical History:  Diagnosis Date   Hypertension    Past Surgical  History:  Procedure Laterality Date   ANKLE FRACTURE SURGERY     FRACTURE SURGERY  09/1972   Family History  Problem Relation Age of Onset   Heart failure Mother    Arthritis Mother    Cancer Father    Cancer Brother    Social History   Socioeconomic History   Marital status: Single    Spouse name: Not on file   Number of children: Not on file   Years of education: Not on file   Highest education level: Not on file  Occupational History   Not on file  Tobacco Use   Smoking status: Never   Smokeless tobacco: Never  Substance and Sexual Activity   Alcohol use: Yes    Alcohol/week: 4.0 standard drinks of alcohol    Types: 4 Cans of beer per week   Drug use: Never   Sexual activity: Yes    Birth control/protection: None  Other Topics Concern   Not on file  Social History Narrative   Not on file   Social Determinants of Health   Financial Resource Strain: Low Risk  (04/25/2023)   Overall Financial Resource Strain (CARDIA)    Difficulty of Paying Living Expenses: Not hard at all  Food Insecurity: No Food Insecurity (04/25/2023)   Hunger Vital Sign    Worried About Running Out of Food in the Last Year: Never true    Ran Out of Food in the Last Year: Never true  Transportation  Needs: No Transportation Needs (04/25/2023)   PRAPARE - Administrator, Civil Service (Medical): No    Lack of Transportation (Non-Medical): No  Physical Activity: Insufficiently Active (04/25/2023)   Exercise Vital Sign    Days of Exercise per Week: 3 days    Minutes of Exercise per Session: 30 min  Stress: No Stress Concern Present (04/25/2023)   [employer] of Occupational Health - Occupational Stress Questionnaire    Feeling of Stress : Not at all  Social Connections: Socially Integrated (04/25/2023)   Social Connection and Isolation Panel [NHANES]    Frequency of Communication with Friends and Family: More than three times a week    Frequency of Social Gatherings with Friends  and Family: More than three times a week    Attends Religious Services: 1 to 4 times per year    Active Member of [employer] or Organizations: Yes    Attends Banker Meetings: 1 to 4 times per year    Marital Status: Living with partner    Tobacco Counseling Counseling given: Not Answered   Clinical Intake:  Pre-visit preparation completed: Yes  Pain : No/denies pain  Diabetes: No  How often do you need to have someone help you when you read instructions, pamphlets, or other written materials from your doctor or pharmacy?: 1 - Never  Diabetic?No   Interpreter Needed?: No  Information entered by :: Kandis Fantasia LPN   Activities of Daily Living    04/25/2023    8:55 AM 04/22/2023    4:19 PM  In your present state of health, do you have any difficulty performing the following activities:  Hearing? 1 1  Vision? 0 0  Difficulty concentrating or making decisions? 0 0  Walking or climbing stairs? 0 0  Dressing or bathing? 0 0  Doing errands, shopping? 0 0  Preparing Food and eating ? N N  Using the Toilet? N N  In the past six months, have you accidently leaked urine? N N  Do you have problems with loss of bowel control? N N  Managing your Medications? N N  Managing your Finances? N N  Housekeeping or managing your Housekeeping? N N    Patient Care Team: Alicia Amel, MD as PCP - General (Family Medicine)  Indicate any recent Medical Services you may have received from other than [facility] providers in the past year (date may be approximate).     Assessment:   This is a routine wellness examination for [name].  Hearing/Vision screen Hearing Screening - Comments:: Denies hearing difficulties   Vision Screening - Comments:: up to date with routine eye exams with    Dietary issues and exercise activities discussed: Current Exercise Habits: Home exercise routine, Type of exercise: walking;stretching, Time (Minutes): 30, Frequency (Times/Week): 3, Weekly  Exercise (Minutes/Week): 90, Intensity: Mild   Goals Addressed             This Visit's Progress    Remain active and independent        Depression Screen    04/25/2023    8:53 AM 12/20/2022    9:25 AM 12/16/2022    8:51 AM 12/24/2021    2:28 PM 10/08/2021    1:47 PM 01/30/2021    1:46 PM 08/05/2020    9:25 AM  PHQ 2/9 Scores  PHQ - 2 Score 0 0 0 0 0 0 0  PHQ- 9 Score  0 0 0 0 0 0    Fall Risk  04/25/2023    8:52 AM 04/22/2023    4:19 PM 12/16/2022    8:51 AM 12/24/2021    2:27 PM 01/30/2021    1:46 PM  Fall Risk   Falls in the past year? 0 0 0 0 0  Number falls in past yr: 0  0 0 0  Injury with Fall? 0  0 0 0  Risk for fall due to : No Fall Risks      Follow up Falls prevention discussed;Education provided;Falls evaluation completed        FALL RISK PREVENTION PERTAINING TO THE HOME:  Any stairs in or around the home? Yes  If so, are there any without handrails? No  Home free of loose throw rugs in walkways, pet beds, electrical cords, etc? Yes  Adequate lighting in your home to reduce risk of falls? Yes   ASSISTIVE DEVICES UTILIZED TO PREVENT FALLS:  Life alert? No  Use of a cane, walker or w/c? No  Grab bars in the bathroom? Yes  Shower chair or bench in shower? No  Elevated toilet seat or a handicapped toilet? Yes   TIMED UP AND GO:  Was the test performed? No . Telephonic visit   Cognitive Function:        04/25/2023    8:56 AM  6CIT Screen  What Year? 0 points  What month? 0 points  What time? 0 points  Count back from 20 0 points  Months in reverse 0 points  Repeat phrase 0 points  Total Score 0 points    Immunizations Immunization History  Administered Date(s) Administered   COVID-19, mRNA, vaccine(Comirnaty)12 years and older 09/23/2022   Fluad Quad(high Dose 65+) 08/05/2020, 10/08/2021   Influenza-Unspecified 09/23/2022   PFIZER(Purple Top)SARS-COV-2 Vaccination 01/31/2020, 02/25/2020, 09/30/2020   PNEUMOCOCCAL CONJUGATE-20 01/30/2021    Pfizer Covid-19 Vaccine Bivalent Booster 24yrs & up 10/08/2021   Zoster Recombinat (Shingrix) 09/23/2022, 12/14/2022    TDAP status: Due, Education has been provided regarding the importance of this vaccine. Advised may receive this vaccine at local pharmacy or Health Dept. Aware to provide a copy of the vaccination record if obtained from local pharmacy or Health Dept. Verbalized acceptance and understanding.  Pneumococcal vaccine status: Up to date  Covid-19 vaccine status: Information provided on how to obtain vaccines.   Qualifies for Shingles Vaccine? Yes   Zostavax completed No   Shingrix Completed?: Yes  Screening Tests Health Maintenance  Topic Date Due   DTaP/Tdap/Td (1 - Tdap) Never done   INFLUENZA VACCINE  06/09/2023   Medicare Annual Wellness (AWV)  04/24/2024   Colonoscopy  11/08/2024   Pneumonia Vaccine 25+ Years old  Completed   COVID-19 Vaccine  Completed   Hepatitis C Screening  Completed   Zoster Vaccines- Shingrix  Completed   HPV VACCINES  Aged Out    Health Maintenance  Health Maintenance Due  Topic Date Due   DTaP/Tdap/Td (1 - Tdap) Never done    Colorectal cancer screening: Type of screening: Colonoscopy. Completed 11/08/2014. Repeat every 10 years  Lung Cancer Screening: (Low Dose CT Chest recommended if Age 39-80 years, 30 pack-year currently smoking OR have quit w/in 15years.) does not qualify.   Lung Cancer Screening Referral: n/a  Additional Screening:  Hepatitis C Screening: does qualify; Completed 10/08/21  Vision Screening: Recommended annual ophthalmology exams for early detection of glaucoma and other disorders of the eye. Is the patient up to date with their annual eye exam?  Yes  Who is the provider or  what is the name of the office in which the patient attends annual eye exams? Unable to provide name  If pt is not established with a provider, would they like to be referred to a provider to establish care? No .   Dental Screening:  Recommended annual dental exams for proper oral hygiene  Community Resource Referral / Chronic Care Management: CRR required this visit?  No   CCM required this visit?  No      Plan:     I have personally reviewed and noted the following in the patient's chart:   Medical and social history Use of alcohol, tobacco or illicit drugs  Current medications and supplements including opioid prescriptions. Patient is not currently taking opioid prescriptions. Functional ability and status Nutritional status Physical activity Advanced directives List of other physicians Hospitalizations, surgeries, and ER visits in previous 12 months Vitals Screenings to include cognitive, depression, and falls Referrals and appointments  In addition, I have reviewed and discussed with patient certain preventive protocols, quality metrics, and best practice recommendations. A written personalized care plan for preventive services as well as general preventive health recommendations were provided to patient.     Durwin Nora, [place]   1/61/0960   Due to this being a virtual visit, the after visit summary with patients personalized plan was offered to patient via mail or my-chart. Patient would like to access on my-chart  Nurse Notes: No concerns

## 2023-07-12 ENCOUNTER — Other Ambulatory Visit: Payer: Self-pay | Admitting: Student

## 2023-07-12 DIAGNOSIS — F419 Anxiety disorder, unspecified: Secondary | ICD-10-CM

## 2023-07-12 DIAGNOSIS — I10 Essential (primary) hypertension: Secondary | ICD-10-CM

## 2023-08-17 ENCOUNTER — Ambulatory Visit (INDEPENDENT_AMBULATORY_CARE_PROVIDER_SITE_OTHER): Payer: Medicare Other

## 2023-08-17 ENCOUNTER — Ambulatory Visit: Payer: Medicare Other | Admitting: Sports Medicine

## 2023-08-17 VITALS — BP 132/84 | HR 71 | Ht 67.0 in | Wt 219.0 lb

## 2023-08-17 DIAGNOSIS — M5136 Other intervertebral disc degeneration, lumbar region with discogenic back pain only: Secondary | ICD-10-CM

## 2023-08-17 DIAGNOSIS — M545 Low back pain, unspecified: Secondary | ICD-10-CM

## 2023-08-17 DIAGNOSIS — G8929 Other chronic pain: Secondary | ICD-10-CM | POA: Diagnosis not present

## 2023-08-17 MED ORDER — MELOXICAM 15 MG PO TABS: 15.0000 mg | ORAL_TABLET | Freq: Every day | ORAL | 0 refills | Status: DC

## 2023-08-17 NOTE — Progress Notes (Signed)
[facility] [address] [place] [address] Phone: [phone]   Assessment and Plan:    1. Chronic bilateral low back pain without sciatica 2. Degeneration of intervertebral disc of lumbar region with discogenic back pain -Chronic with exacerbation, initial sports medicine visit - Consistent with either flare of lumbar DDD versus lower lumbar muscular strain caused from contusion from fall and worsened by physical activity golfing - X-ray obtained in clinic.  My interpretation: No acute fracture or vertebral collapse.  Relatively unchanged DDD compared with image from 2022 - Start meloxicam 15 mg daily x2 weeks.  If still having pain after 2 weeks, complete 3rd-week of meloxicam. May use remaining meloxicam as needed once daily for pain control.  Do not to use additional NSAIDs while taking meloxicam.  May use Tylenol [phone] mg 2 to 3 times a day for breakthrough pain. - Start HEP for low back  15 additional minutes spent for educating Therapeutic Home Exercise Program.  This included exercises focusing on stretching, strengthening, with focus on eccentric aspects.   Long term goals include an improvement in range of motion, strength, endurance as well as avoiding reinjury. Patient's frequency would include in 1-2 times a day, 3-5 times a week for a duration of 6-12 weeks. Proper technique shown and discussed handout in great detail with ATC.  All questions were discussed and answered.     Pertinent previous records reviewed include lumbar x-ray 2022   Follow Up: 4 weeks for reevaluation.  If no improvement or worsening of symptoms, could discuss OMT versus physical therapy   Subjective:   I, Elijah Washington, am serving as a Neurosurgeon for Doctor Richardean Sale  Chief Complaint: low back pain   HPI:   08/17/23 Patient is a 67 year old male complaining of low back pain. Patient states a few weeks ago he was working on his garage  and he missed the last step he hit is back on something. Was "healing" but is now in flare after golfing yesterday. Acetaminophen for the pain and that helped some. He did have some numbness and tingling when he was laying on his side. Pain radiates to the front of the hip    Relevant Historical Information: Hypertension  Additional pertinent review of systems negative.   Current Outpatient Medications:    atorvastatin (LIPITOR) 40 MG tablet, TAKE 1 TABLET BY MOUTH EVERY DAY, Disp: 90 tablet, Rfl: 3   lisinopril (ZESTRIL) 10 MG tablet, TAKE 1 TABLET BY MOUTH EVERY DAY, Disp: 90 tablet, Rfl: 0   meloxicam (MOBIC) 15 MG tablet, Take 1 tablet (15 mg total) by mouth daily., Disp: 30 tablet, Rfl: 0   PARoxetine (PAXIL) 20 MG tablet, TAKE 1 TABLET BY MOUTH EVERY DAY, Disp: 90 tablet, Rfl: 0   sildenafil (VIAGRA) 25 MG tablet, Take 25 mg by mouth daily as needed for erectile dysfunction., Disp: , Rfl:    Objective:     Vitals:   08/17/23 1030  BP: 132/84  Pulse: 71  SpO2: 98%  Weight: 219 lb (99.3 kg)  Height: 5\' 7"  (1.702 m)      Body mass index is 34.3 kg/m.    Physical Exam:    Gen: Appears well, nad, nontoxic and pleasant Psych: Alert and oriented, appropriate mood and affect Neuro: sensation intact, strength is 5/5 in upper and lower extremities, muscle tone wnl Skin: no susupicious lesions or rashes  Back - Normal skin, Spine with normal alignment and no  deformity.   No tenderness to vertebral process palpation.   Right lumbar paraspinous muscles are   tender and without spasm NTTP gluteal musculature Straight leg raise negative Trendelenberg negative Piriformis Test negative Gait normal No pain with lumbar flexion or rotation Low back pain reproduced by lumbar extension   Electronically signed by:  [facility] 12:08 PM 08/17/23

## 2023-08-17 NOTE — Patient Instructions (Addendum)
Low back HEP - Start meloxicam 15 mg daily x2 weeks.  If still having pain after 2 weeks, complete 3rd-week of meloxicam. May use remaining meloxicam as needed once daily for pain control.  Do not to use additional NSAIDs while taking meloxicam.  May use Tylenol [phone] mg 2 to 3 times a day for breakthrough pain. 4 week follow up

## 2023-09-13 NOTE — Progress Notes (Unsigned)
    Elijah Washington [name] [address] [place] [address] Phone: [phone]   Assessment and Plan:     There are no diagnoses linked to this encounter.  ***   Pertinent previous records reviewed include ***   Follow Up: ***     Subjective:   I, Elijah Washington, am serving as a Neurosurgeon for Doctor Richardean Sale   Chief Complaint: low back pain    HPI:    08/17/23 Patient is a 67 year old male complaining of low back pain. Patient states a few weeks ago he was working on his garage and he missed the last step he hit is back on something. Was "healing" but is now in flare after golfing yesterday. Acetaminophen for the pain and that helped some. He did have some numbness and tingling when he was laying on his side. Pain radiates to the front of the hip    09/14/2023 Patient states   Relevant Historical Information: Hypertension    Additional pertinent review of systems negative.   Current Outpatient Medications:    atorvastatin (LIPITOR) 40 MG tablet, TAKE 1 TABLET BY MOUTH EVERY DAY, Disp: 90 tablet, Rfl: 3   lisinopril (ZESTRIL) 10 MG tablet, TAKE 1 TABLET BY MOUTH EVERY DAY, Disp: 90 tablet, Rfl: 0   meloxicam (MOBIC) 15 MG tablet, Take 1 tablet (15 mg total) by mouth daily., Disp: 30 tablet, Rfl: 0   PARoxetine (PAXIL) 20 MG tablet, TAKE 1 TABLET BY MOUTH EVERY DAY, Disp: 90 tablet, Rfl: 0   sildenafil (VIAGRA) 25 MG tablet, Take 25 mg by mouth daily as needed for erectile dysfunction., Disp: , Rfl:    Objective:     There were no vitals filed for this visit.    There is no height or weight on file to calculate BMI.    Physical Exam:    ***   Electronically signed by:  Elijah Washington [name] 3:28 PM 09/13/23

## 2023-09-14 ENCOUNTER — Ambulatory Visit: Payer: Medicare Other | Admitting: Sports Medicine

## 2023-09-14 VITALS — BP 124/82 | HR 71 | Ht 67.0 in | Wt 224.0 lb

## 2023-09-14 DIAGNOSIS — G8929 Other chronic pain: Secondary | ICD-10-CM

## 2023-09-14 DIAGNOSIS — M5136 Other intervertebral disc degeneration, lumbar region with discogenic back pain only: Secondary | ICD-10-CM

## 2023-09-14 DIAGNOSIS — M545 Low back pain, unspecified: Secondary | ICD-10-CM | POA: Diagnosis not present

## 2023-09-14 NOTE — Patient Instructions (Signed)
Tylenol [phone] mg 2-3 times a day for pain relief  Discontinue meloxicam and use remainder as needed  Continue HEP Stretch before and after golf  As needed follow up

## 2023-10-10 ENCOUNTER — Other Ambulatory Visit: Payer: Self-pay | Admitting: Student

## 2023-10-10 DIAGNOSIS — F32A Depression, unspecified: Secondary | ICD-10-CM

## 2023-10-10 DIAGNOSIS — I10 Essential (primary) hypertension: Secondary | ICD-10-CM

## 2024-01-08 ENCOUNTER — Other Ambulatory Visit: Payer: Self-pay | Admitting: Student

## 2024-01-10 ENCOUNTER — Other Ambulatory Visit: Payer: Self-pay | Admitting: Student

## 2024-01-10 DIAGNOSIS — I10 Essential (primary) hypertension: Secondary | ICD-10-CM

## 2024-01-10 DIAGNOSIS — F32A Depression, unspecified: Secondary | ICD-10-CM

## 2024-01-10 DIAGNOSIS — F419 Anxiety disorder, unspecified: Secondary | ICD-10-CM

## 2024-02-01 ENCOUNTER — Encounter: Payer: Self-pay | Admitting: Student

## 2024-02-02 MED ORDER — SILDENAFIL CITRATE 25 MG PO TABS: 25.0000 mg | ORAL_TABLET | Freq: Every day | ORAL | 0 refills | Status: DC | PRN

## 2024-03-12 DIAGNOSIS — M65342 Trigger finger, left ring finger: Secondary | ICD-10-CM | POA: Diagnosis not present

## 2024-03-28 ENCOUNTER — Other Ambulatory Visit: Payer: Self-pay | Admitting: Student

## 2024-04-06 ENCOUNTER — Other Ambulatory Visit: Payer: Self-pay | Admitting: Student

## 2024-04-06 DIAGNOSIS — I10 Essential (primary) hypertension: Secondary | ICD-10-CM

## 2024-04-06 DIAGNOSIS — F419 Anxiety disorder, unspecified: Secondary | ICD-10-CM

## 2024-04-17 ENCOUNTER — Encounter: Payer: Self-pay | Admitting: *Deleted

## 2024-04-26 ENCOUNTER — Ambulatory Visit: Payer: Medicare Other

## 2024-04-26 VITALS — BP 124/82 | Ht 67.0 in | Wt 214.0 lb

## 2024-04-26 DIAGNOSIS — Z0000 Encounter for general adult medical examination without abnormal findings: Secondary | ICD-10-CM

## 2024-04-26 NOTE — Patient Instructions (Signed)
 Elijah Washington , Thank you for taking time out of your busy schedule to complete your Annual Wellness Visit with me. I enjoyed our conversation and look forward to speaking with you again next year. I, as well as your care team,  appreciate your ongoing commitment to your health goals. Please review the following plan we discussed and let me know if I can assist you in the future. Your Game plan/ To Do List    Referrals: If you haven't heard from the office you've been referred to, please reach out to them at the phone provided.  None  Follow up Visits: Next Medicare AWV with our clinical staff: 04/29/2025   Have you seen your provider in the last 6 months (3 months if uncontrolled diabetes)? No Next Office Visit with your provider: none at the moment  Clinician Recommendations:  Aim for 30 minutes of exercise or brisk walking, 6-8 glasses of water, and 5 servings of fruits and vegetables each day.       This is a list of the screening recommended for you and due dates:  Health Maintenance  Topic Date Due   DTaP/Tdap/Td vaccine (1 - Tdap) Never done   COVID-19 Vaccine (6 - 2024-25 season) 07/10/2023   Flu Shot  06/08/2024   Colon Cancer Screening  11/08/2024   Medicare Annual Wellness Visit  04/26/2025   Pneumococcal Vaccine for age over 34  Completed   Hepatitis C Screening  Completed   Zoster (Shingles) Vaccine  Completed   HPV Vaccine  Aged Out   Meningitis B Vaccine  Aged Out    Advanced directives:  Advance Care Planning is important because it:  [x]  Makes sure you receive the medical care that is consistent with your values, goals, and preferences  [x]  It provides guidance to your family and loved ones and reduces their decisional burden about whether or not they are making the right decisions based on your wishes.  Follow the link provided in your after visit summary or read over the paperwork we have mailed to you to help you started getting your Advance Directives in place.  If you need assistance in completing these, please reach out to us  so that we can help you!  See attachments for Preventive Care and Fall Prevention Tips.

## 2024-04-26 NOTE — Progress Notes (Signed)
 Because this visit was a virtual/telehealth visit,  certain criteria was not obtained, such a blood pressure, CBG if applicable, and timed get up and go. Any medications not marked as taking were not mentioned during the medication reconciliation part of the visit. Any vitals not documented were not able to be obtained due to this being a telehealth visit or patient was unable to self-report a recent blood pressure reading due to a lack of equipment at home via telehealth. Vitals that have been documented are verbally provided by the patient.  This visit was performed by a medical professional under my direct supervision. I was immediately available for consultation/collaboration. I have reviewed and agree with the Annual Wellness Visit documentation.  Subjective:   Elijah Washington is a 68 y.o. who presents for a Medicare Wellness preventive visit.  As a reminder, Annual Wellness Visits don't include a physical exam, and some assessments may be limited, especially if this visit is performed virtually. We may recommend an in-person follow-up visit with your provider if needed.  Visit Complete: Virtual I connected with  Elijah Washington on 04/26/24 by a audio enabled telemedicine application and verified that I am speaking with the correct person using two identifiers.  Patient Location: Home  Provider Location: Home Office  I discussed the limitations of evaluation and management by telemedicine. The patient expressed understanding and agreed to proceed.  Vital Signs: Because this visit was a virtual/telehealth visit, some criteria may be missing or patient reported. Any vitals not documented were not able to be obtained and vitals that have been documented are patient reported.  VideoDeclined- This patient declined Librarian, academic. Therefore the visit was completed with audio only.  Persons Participating in Visit: Patient.  AWV [place]: No:  Patient Medicare AWV Washington was not completed prior to this visit.  Cardiac Risk Factors include: advanced age (>54men, >53 women);obesity (BMI >30kg/m2);dyslipidemia     Objective:    Today's Vitals   04/26/24 0842  BP: 124/82  Weight: 214 lb (97.1 kg)  Height: 5' 7 (1.702 m)   Body mass index is 33.52 kg/m.     04/26/2024    8:48 AM 04/25/2023    8:55 AM 12/20/2022    9:25 AM 12/16/2022    8:53 AM 12/24/2021    2:28 PM 01/30/2021    1:46 PM  Advanced Directives  Does Patient Have a Medical Advance Directive? No No No No No No  Would patient like information on creating a medical advance directive? No - Patient declined Yes (MAU/Ambulatory/Procedural Areas - Information given) No - Patient declined No - Patient declined No - Patient declined No - Patient declined    Current Medications (verified) Outpatient Encounter Medications as of 04/26/2024  Medication Sig   atorvastatin  (LIPITOR) 40 MG tablet TAKE 1 TABLET BY MOUTH EVERY DAY   lisinopril  (ZESTRIL ) 10 MG tablet TAKE 1 TABLET BY MOUTH EVERY DAY   PARoxetine  (PAXIL ) 20 MG tablet TAKE 1 TABLET BY MOUTH EVERY DAY   sildenafil  (VIAGRA ) 25 MG tablet TAKE 1 TABLET BY MOUTH DAILY AS NEEDED FOR ERECTILE DYSFUNCTION.   meloxicam  (MOBIC ) 15 MG tablet Take 1 tablet (15 mg total) by mouth daily.   No facility-administered encounter medications on file as of 04/26/2024.    Allergies (verified) Patient has no known allergies.   History: Past Medical History:  Diagnosis Date   Hypertension    Past Surgical History:  Procedure Laterality Date   ANKLE FRACTURE SURGERY  FRACTURE SURGERY  09/1972   Family History  Problem Relation Age of Onset   Heart failure Mother    Arthritis Mother    Cancer Father    Cancer Brother    Social History   Socioeconomic History   Marital status: Single    Spouse name: Not on file   Number of children: Not on file   Years of education: Not on file   Highest education level:  Master's degree (e.g., MA, MS, MEng, MEd, MSW, MBA)  Occupational History   Not on file  Tobacco Use   Smoking status: Never   Smokeless tobacco: Never  Substance and Sexual Activity   Alcohol use: Yes    Alcohol/week: 4.0 standard drinks of alcohol    Types: 4 Cans of beer per week   Drug use: Never   Sexual activity: Yes    Birth control/protection: None  Other Topics Concern   Not on file  Social History Narrative   Not on file   Social Drivers of Health   Financial Resource Strain: Low Risk  (04/20/2024)   Overall Financial Resource Strain (CARDIA)    Difficulty of Paying Living Expenses: Not hard at all  Food Insecurity: No Food Insecurity (04/20/2024)   Hunger Vital Sign    Worried About Running Out of Food in the Last Year: Never true    Ran Out of Food in the Last Year: Never true  Transportation Needs: No Transportation Needs (04/20/2024)   PRAPARE - Administrator, Civil Service (Medical): No    Lack of Transportation (Non-Medical): No  Physical Activity: Insufficiently Active (04/20/2024)   Exercise Vital Sign    Days of Exercise per Week: 2 days    Minutes of Exercise per Session: 60 min  Stress: No Stress Concern Present (04/20/2024)   Elijah Washington    Feeling of Stress: Not at all  Social Connections: Socially Integrated (04/20/2024)   Social Connection and Isolation Panel    Frequency of Communication with Friends and Family: Once a week    Frequency of Social Gatherings with Friends and Family: Twice a week    Attends Religious Services: More than 4 times per year    Active Member of [employer] or Organizations: Yes    Attends Engineer, structural: More than 4 times per year    Marital Status: Living with partner    Tobacco Counseling Counseling given: Not Answered    Clinical Intake:  Pre-visit preparation completed: Yes  Pain : 0-10 Pain Type: Acute pain Pain Location:  Neck Pain Descriptors / Indicators: Aching Pain Onset: Today Pain Frequency: Rarely     BMI - recorded: 33.52 Nutritional Status: BMI > 30  Obese Nutritional Risks: None Diabetes: No  Lab Results  Component Value Date   HGBA1C 5.3 08/05/2020     How often do you need to have someone help you when you read instructions, pamphlets, or other written materials from your doctor or pharmacy?: 1 - Never What is the last grade level you completed in school?: master degree  Interpreter Needed?: No Patient Washington was filled out by patient on 04/20/2024.   Activities of Daily Living     04/20/2024    9:53 AM  In your present state of health, do you have any difficulty performing the following activities:  Hearing? 0  Vision? 0  Difficulty concentrating or making decisions? 0  Walking or climbing stairs? 0  Dressing or bathing?  0  Doing errands, shopping? 0  Preparing Food and eating ? N  Using the Toilet? N  In the past six months, have you accidently leaked urine? N  Do you have problems with loss of bowel control? N  Managing your Medications? N  Managing your Finances? N  Housekeeping or managing your Housekeeping? N    Patient Care Team: Limmie Ren, MD as PCP - General (Family Medicine)  I have updated your Care Teams any recent Medical Services you may have received from other providers in the past year.     Assessment:   This is a routine wellness examination for Elijah Washington.  Hearing/Vision screen Hearing Screening - Comments:: Patient has some hearing loss but no hearing aids  Vision Screening - Comments:: Patient wears glasses    Goals Addressed             This Visit's Progress    Patient Stated       Patient would like to lose a little weight        Depression Screen     04/26/2024    8:49 AM 04/25/2023    8:53 AM 12/20/2022    9:25 AM 12/16/2022    8:51 AM 12/24/2021    2:28 PM 10/08/2021    1:47 PM 01/30/2021    1:46 PM  PHQ 2/9  Scores  PHQ - 2 Score 0 0 0 0 0 0 0  PHQ- 9 Score 0  0 0 0 0 0    Fall Risk     04/20/2024    9:53 AM 04/25/2023    8:52 AM 04/22/2023    4:19 PM 12/16/2022    8:51 AM 12/24/2021    2:27 PM  Fall Risk   Falls in the past year? 1 0 0 0 0  Number falls in past yr: 0 0  0 0  Injury with Fall? 1 0  0 0  Risk for fall due to : History of fall(s) No Fall Risks     Follow up Falls evaluation completed;Falls prevention discussed Falls prevention discussed;Education provided;Falls evaluation completed       MEDICARE RISK AT HOME:  Medicare Risk at Home Any stairs in or around the home?: (Patient-Rptd) No If so, are there any without handrails?: No Home free of loose throw rugs in walkways, pet beds, electrical cords, etc?: (Patient-Rptd) Yes Adequate lighting in your home to reduce risk of falls?: (Patient-Rptd) Yes Life alert?: (Patient-Rptd) No Use of a cane, walker or w/c?: (Patient-Rptd) No Grab bars in the bathroom?: (Patient-Rptd) No Shower chair or bench in shower?: (Patient-Rptd) No Elevated toilet seat or a handicapped toilet?: (Patient-Rptd) No  TIMED UP AND GO:  Was the test performed?  no  Cognitive Function: 6CIT completed        04/26/2024    8:46 AM 04/25/2023    8:56 AM  6CIT Screen  What Year? 0 points 0 points  What month? 0 points 0 points  What time? 0 points 0 points  Count back from 20 0 points 0 points  Months in reverse 0 points 0 points  Repeat phrase 0 points 0 points  Total Score 0 points 0 points    Immunizations Immunization History  Administered Date(s) Administered   Fluad Quad(high Dose 65+) 08/05/2020, 10/08/2021   Influenza-Unspecified 09/23/2022   PFIZER(Purple Top)SARS-COV-2 Vaccination 01/31/2020, 02/25/2020, 09/30/2020   PNEUMOCOCCAL CONJUGATE-20 01/30/2021   Pfizer Covid-19 Vaccine Bivalent Booster 31yrs & up 10/08/2021   Pfizer(Comirnaty)Fall Seasonal Vaccine 12  years and older 09/23/2022   Zoster Recombinant(Shingrix) 09/23/2022,  12/14/2022    Screening Tests Health Maintenance  Topic Date Due   DTaP/Tdap/Td (1 - Tdap) Never done   COVID-19 Vaccine (6 - 2024-25 season) 07/10/2023   INFLUENZA VACCINE  06/08/2024   Colonoscopy  11/08/2024   Medicare Annual Wellness (AWV)  04/26/2025   Pneumococcal Vaccine: 50+ Years  Completed   Hepatitis C Screening  Completed   Zoster Vaccines- Shingrix  Completed   HPV VACCINES  Aged Out   Meningococcal B Vaccine  Aged Out    Health Maintenance  Health Maintenance Due  Topic Date Due   DTaP/Tdap/Td (1 - Tdap) Never done   COVID-19 Vaccine (6 - 2024-25 season) 07/10/2023   Health Maintenance Items Addressed:patient declined vacciations  Additional Screening:  Vision Screening: Recommended annual ophthalmology exams for early detection of glaucoma and other disorders of the eye. Would you like a referral to an eye doctor? No    Dental Screening: Recommended annual dental exams for proper oral hygiene  Community Resource Referral / Chronic Care Management: CRR required this visit?  No   CCM required this visit?  No   Plan:    I have personally reviewed and noted the following in the patient's chart:   Medical and social history Use of alcohol, tobacco or illicit drugs  Current medications and supplements including opioid prescriptions. Patient is not currently taking opioid prescriptions. Functional ability and status Nutritional status Physical activity Advanced directives List of other physicians Hospitalizations, surgeries, and ER visits in previous 12 months Vitals Screenings to include cognitive, depression, and falls Referrals and appointments  In addition, I have reviewed and discussed with patient certain preventive protocols, quality metrics, and best practice recommendations. A written personalized care plan for preventive services as well as general preventive health recommendations were provided to patient.   Freeda Jerry,  [place]   04/26/2024   After Visit Summary: (MyChart) Due to this being a telephonic visit, the after visit summary with patients personalized plan was offered to patient via MyChart   Notes: Nothing significant to report at this time.

## 2024-04-27 DIAGNOSIS — M65342 Trigger finger, left ring finger: Secondary | ICD-10-CM | POA: Diagnosis not present

## 2024-06-25 ENCOUNTER — Ambulatory Visit (INDEPENDENT_AMBULATORY_CARE_PROVIDER_SITE_OTHER): Admitting: Family Medicine

## 2024-06-25 VITALS — BP 142/76 | HR 92 | Ht 67.0 in | Wt 220.0 lb

## 2024-06-25 DIAGNOSIS — M25551 Pain in right hip: Secondary | ICD-10-CM | POA: Diagnosis not present

## 2024-06-25 MED ORDER — METHYLPREDNISOLONE ACETATE 80 MG/ML IJ SUSP
80.0000 mg | Freq: Once | INTRAMUSCULAR | Status: AC
  Administered 2024-06-25: 80 mg via INTRA_ARTICULAR

## 2024-06-25 MED ORDER — NAPROXEN 500 MG PO TABS: 500.0000 mg | ORAL_TABLET | Freq: Two times a day (BID) | ORAL | 0 refills | Status: DC

## 2024-06-25 MED ORDER — METHYLPREDNISOLONE ACETATE 80 MG/ML IJ SUSP: 80.0000 mg | Freq: Once | INTRAMUSCULAR | Status: DC

## 2024-06-25 NOTE — Patient Instructions (Signed)
 It was wonderful to see you today! Thank you for choosing [facility].   Please bring ALL of your medications with you to every visit.   Today we talked about:  I think you have inflammation and some of the fluid-filled sacs around your right hip.  You received an injection in this area that could help with your symptoms.  You can also take the naproxen  twice per day for the next 2 weeks.  Please do not use it beyond this time as there is side effects with long-term use.  You can also use Tylenol for pain management.  You can also use topical Voltaren gel if you prefer instead of the oral naproxen .  Once your pain has improved please try to do some stretching at home, please see the attached exercises.  Please follow up in 2 weeks if not improved  Call the clinic at [phone] if your symptoms worsen or you have any concerns.  Please be sure to schedule follow up at the front desk before you leave today.   Izetta Nap, DO Family Medicine

## 2024-06-25 NOTE — Progress Notes (Signed)
 I saw and examined the patient and have discussed with the resident. I agree with the resident's note. I supervised and was present for the key portions of the procedure.

## 2024-06-25 NOTE — Progress Notes (Signed)
    SUBJECTIVE:   CHIEF COMPLAINT / HPI:   Elijah Washington presents to [facility] for R leg pain since Saturday.   He states that he was in his usual state of health on Saturday when he sat down on the couch to watch TV for a few hours. Onset occurred after getting up from couch with pain localized to the right trochanter. No pain in the leg itself, but reports pain on the top of the right foot, likely due to compensatory limping as this occurred the next day. Describes the pain as feeling like a heavy muscle soreness. Notes that movement improves somewhat when pushing in on the right leg. Tylenol has not provided relief. Denies fevers/chills, trauma, and loss of bladder/BM.   PERTINENT  PMH / PSH:  Chronic bilateral low back pain without sciatica, Degeneration of intervertebral disc of lumbar region with discogenic back pain   OBJECTIVE:   BP (!) 142/76   Pulse 92   Ht 5' 7 (1.702 m)   Wt 220 lb (99.8 kg)   SpO2 99%   BMI 34.46 kg/m   General: NAD, pleasant, able to participate in exam Cardiac: RRR, no murmurs. Respiratory: CTAB, normal effort, No wheezes, rales or rhonchi Abdomen: Bowel sounds present, nontender, nondistended MSK: Point tenderness over the right greater trochanter. No tenderness along SI joint or lumbar spine. Pain with straight leg raise but without radicular symptoms. Unable to participate in other exams involving the R leg due to pain inclduing Debby, FABER and FADIR. Skin: warm and dry, no rashes noted  ASSESSMENT/PLAN:   Assessment & Plan Right hip pain Most likely 2/2 trochanteric bursitis, difficult to examine low back and hip given severity of pain but less likely lumbar strain and flare of hip OA. No indication of infectious etiology. - Trigger point injection (Depo-Medrol  + lidocaine) given today - Start naproxen  BID  2 weeks - Tylenol or topical Voltaren gel as needed for pain. - Begin home stretching exercises once pain improves - Follow up in 2 weeks if no  improvement.   Nonda Carrie, Medical Student [facility]   Upper Level Addendum:   I have seen and evaluated this patient along with Student Doctor Carrie and reviewed the above note, making necessary revisions as appropriate.  I agree with the medical decision making and physical exam as noted above.   Izetta Nap, DO PGY-3, [facility] Res[facility]

## 2024-06-25 NOTE — Progress Notes (Deleted)
    SUBJECTIVE:   CHIEF COMPLAINT / HPI:   Pain in R leg   PERTINENT  PMH / PSH: ***  OBJECTIVE:   There were no vitals taken for this visit. ***  General: NAD, pleasant, able to participate in exam Cardiac: RRR, no murmurs. Respiratory: CTAB, normal effort, No wheezes, rales or rhonchi Abdomen: Bowel sounds present, nontender, nondistended Extremities: no edema or cyanosis. Skin: warm and dry, no rashes noted Neuro: alert, no obvious focal deficits Psych: Normal affect and mood  ASSESSMENT/PLAN:   No problem-specific Assessment & Plan notes found for this encounter.     Dr. Izetta Nap, DO [facility]    {    This will disappear when note is signed, click to select method of visit    :1}

## 2024-06-26 ENCOUNTER — Encounter: Payer: Self-pay | Admitting: Family Medicine

## 2024-06-26 ENCOUNTER — Ambulatory Visit: Admitting: Family Medicine

## 2024-06-26 VITALS — BP 128/79 | HR 95 | Ht 67.0 in | Wt 222.2 lb

## 2024-06-26 DIAGNOSIS — J392 Other diseases of pharynx: Secondary | ICD-10-CM

## 2024-06-26 NOTE — Telephone Encounter (Signed)
 Called patient to discuss concern further.   He reports taking dose of Naproxen  at around 7 pm last night with his dinner.   States that this morning when he woke up, he was experiencing slight swelling in his throat and tongue. He also reports coughing up small amount of phlegm.   He reports normal breathing. Denies chest pain, palpitations, shortness of breath, difficulty swallowing. He is speaking in complete sentences.   He also took his daily lisinopril  last night.   Spoke with Dr.Pray. Advised that patient should be re-evaluated in clinic this PM. She gave permission to schedule at 4:10 with Dr. Theophilus.   Advised that patient could also take Benadryl at this time.   Advised patient of this and to hold any further doses of Naproxen  and Lisinopril  per Dr. Donzetta.   Scheduled for this afternoon. Strict ED precautions discussed.  Patient verbalizes understanding.   Chiquita JAYSON English, RN

## 2024-06-26 NOTE — Progress Notes (Cosign Needed)
    SUBJECTIVE:   CHIEF COMPLAINT / HPI:   Throat swelling Seen yesterday 8/18 for greater trochanteric bursitis of left leg.  Started on naproxen  500 mg twice daily.  Reports he took a dose of naproxen  around 7 PM with dinner.  Noticed he woke up this morning with swelling in his throat and tongue.  Also notes irritation in his mouth with coughing up some phlegm.  Denies any shortness of breath.  Minimal difficulty swallowing.  Reports he has taken NSAIDs in the past without prior difficulty.  Has also been on lisinopril  10 mg daily for some time.  Denies any other facial swelling.  PERTINENT  PMH / PSH: Chronic bilateral low back pain without sciatica, Degeneration of intervertebral disc of lumbar region with discogenic back pain   OBJECTIVE:   BP (!) 157/87   Pulse 97   Ht 5' 7 (1.702 m)   Wt 222 lb 3.2 oz (100.8 kg)   SpO2 100%   BMI 34.80 kg/m    General: NAD, pleasant, able to participate in exam HEENT: Mild erythema and edema noted posterior palate near uvula.  No appreciable oral lesions or other erythema/swelling.  No palpable cervical lymphadenopathy.  No facial swelling noted. Respiratory: Normal work of breathing on room air Extremities: no edema or cyanosis. Skin: warm and dry, no rashes noted Neuro: alert, no obvious focal deficits Psych: Normal affect and mood  ASSESSMENT/PLAN:   Assessment & Plan Pharyngeal edema Mild symptoms upon exam without corresponding facial swelling or shortness of breath, lower concern for angioedema or anaphylactic process.  Likely medication reaction given onset after naproxen , listed NSAIDs under allergy.  Possibly related to use of lisinopril , shared decision making with patient will continue on medication and monitor for symptom recurrence.  Advised on ED precautions if worsening symptoms.    Dr. Izetta Nap, DO [facility]

## 2024-06-26 NOTE — Patient Instructions (Signed)
 It was wonderful to see you today! Thank you for choosing [facility].   Please bring ALL of your medications with you to every visit.   Today we talked about:  You still have a little bit of swelling in the back of your throat and your tongue, you can take additional dose of Benadryl which can help with your symptoms.  I do not think it is severe enough that you need steroids or other treatment at this time and should continue to resolve on its own.  As I mentioned we will list the naproxen  as an allergy and please keep in mind using any NSAIDs in the future could cause a similar response such as aspirin, ibuprofen and Aleve . Your lisinopril  is known to cause things such as a dry cough and even something called angioedema where you get swelling of the face and mouth I can happen at any point while being on the medication.  If your symptoms recur while on the medication I would recommend being seen and stopping the medication.  We have great alternatives that can manage her blood pressure.  Please follow up as needed for persistent symptoms  Call the clinic at [phone] if your symptoms worsen or you have any concerns.  Please be sure to schedule follow up at the front desk before you leave today.   Izetta Nap, DO Family Medicine

## 2024-06-27 NOTE — Addendum Note (Signed)
 Addended by: DONZETTA QUANT E on: 06/27/2024 08:40 AM   Modules accepted: Orders

## 2024-08-20 ENCOUNTER — Ambulatory Visit

## 2024-08-20 VITALS — BP 138/70 | HR 76 | Ht 67.0 in | Wt 216.0 lb

## 2024-08-20 DIAGNOSIS — F32A Depression, unspecified: Secondary | ICD-10-CM

## 2024-08-20 DIAGNOSIS — I10 Essential (primary) hypertension: Secondary | ICD-10-CM

## 2024-08-20 DIAGNOSIS — R198 Other specified symptoms and signs involving the digestive system and abdomen: Secondary | ICD-10-CM | POA: Diagnosis not present

## 2024-08-20 DIAGNOSIS — Z23 Encounter for immunization: Secondary | ICD-10-CM

## 2024-08-20 DIAGNOSIS — F419 Anxiety disorder, unspecified: Secondary | ICD-10-CM

## 2024-08-20 MED ORDER — PAROXETINE HCL 20 MG PO TABS: 20.0000 mg | ORAL_TABLET | Freq: Every day | ORAL | 3 refills | Status: AC

## 2024-08-20 MED ORDER — VERAPAMIL HCL ER 180 MG PO CP24: 180.0000 mg | ORAL_CAPSULE | Freq: Every day | ORAL | 1 refills | Status: DC

## 2024-08-20 NOTE — Assessment & Plan Note (Signed)
-   Refilled paroxetine  20 mg daily

## 2024-08-20 NOTE — Progress Notes (Signed)
    SUBJECTIVE:   CHIEF COMPLAINT / HPI:   Elijah Washington is a 68 y.o. male presenting for med refills. Also reports epigastric fullness.  Medication Management Requesting refills of paroxetine , lisinopril . This 06/2024, had en episode of throat and tongue swelling after starting Naproxen . Had also been taking lisinopril . Had no issues with lisinopril  prior and no issues following; still taking lisinopril . Avoiding NSAIDs; prn tylenol managing pain.  Epigastric Firmness Firmness in epigastric region after eating, every time he eats. Feels firm/hard to touch, resolves after a period of time. Has been happening for years. Not worsening. No pain, reflux sensation. No N/V. Regular BMs, generally 1 a day; soft, not having to strain, no blood.  Healthcare Maintenance: - Flu shot - getting today - COVID booster - getting today - Tdap - discussion deferred to yearly physical - Colonoscopy upcoming due 11/2024 - discussion deferred to yearly physical  PERTINENT  PMH / PSH: HTN, anxiety/depression, HLD  OBJECTIVE:   BP 138/70   Pulse 76   Ht 5' 7 (1.702 m)   Wt 216 lb (98 kg)   SpO2 93%   BMI 33.83 kg/m    Abdomen: Bowel sounds present and normoactive bilaterally. Soft, nondistended, nontender. No masses of epigastric region.  ASSESSMENT/PLAN:   Assessment & Plan Hypertension, unspecified type Risk-benefit discussion today about continuing lisinopril  in setting of recent episode of facial swelling versus switching to another agent. Ultimately, patient decided to switch; will plan to start verapamil today and follow-up effectiveness at upcoming yearly exam - STOP lisinopril  - START verapamil 180 mg daily - Will need CMP at yearly exam in 09/2024 - Patient cautioned on hypotensive symptoms to monitor for - Monitor blood pressure ~3x week, bring log to next OV Abdominal complaints No red flag symptoms. Possibly feeling food bolus versus small hiatal hernia; no prior abdominal  imaging. No need for imaging at this time given stability of symptoms. No need for medication given no reflux sensation. - Will continue to monitor clinically Anxiety and depression - Refilled paroxetine  20 mg daily Encounter for immunization Orders Placed This Encounter  Procedures   Flu vaccine trivalent PF, 6mos and older(Flulaval,Afluria,Fluarix,Fluzone)   Pfizer Comirnaty Covid-19 Vaccine 19yrs & older      Follow-up in mid-09/2024 for yearly physical (appointment already made)  Alan Flies, MD [facility]

## 2024-08-20 NOTE — Assessment & Plan Note (Addendum)
 Risk-benefit discussion today about continuing lisinopril  in setting of recent episode of facial swelling versus switching to another agent. Ultimately, patient decided to switch; will plan to start verapamil today and follow-up effectiveness at upcoming yearly exam - STOP lisinopril  - START verapamil 180 mg daily - Will need CMP at yearly exam in 09/2024 - Patient cautioned on hypotensive symptoms to monitor for - Monitor blood pressure ~3x week, bring log to next OV

## 2024-08-20 NOTE — Patient Instructions (Addendum)
 Dorn A. Wenzl,   It was great seeing you in clinic today! You came in for medication refills. After a discussion about the risks and benefits of continuing your lisinopril  after having an episode of swelling, we are switching your lisinopril  for verapamil 180 mg daily. This medication might make you sleepy, and if so you can take it at bedtime. Please keep a log of your blood pressure readings (~3 a week) to bring to your yearly visit.  We also discussed the firmness in your stomach after eating. I am not concerned that this is anything dangerous that needs further workup at this time. We will continue to monitor.  Please bring all your medications to your next office visit.  Thank you for allowing me to be a part of your care team! Alan Flies, MD [facility] [address] [place], [place], [place] [address] [phone]

## 2024-09-13 ENCOUNTER — Other Ambulatory Visit: Payer: Self-pay

## 2024-09-13 DIAGNOSIS — I10 Essential (primary) hypertension: Secondary | ICD-10-CM

## 2024-09-20 ENCOUNTER — Ambulatory Visit (INDEPENDENT_AMBULATORY_CARE_PROVIDER_SITE_OTHER)

## 2024-09-20 VITALS — BP 146/77 | HR 70 | Ht 67.0 in | Wt 221.0 lb

## 2024-09-20 DIAGNOSIS — I10 Essential (primary) hypertension: Secondary | ICD-10-CM | POA: Diagnosis not present

## 2024-09-20 DIAGNOSIS — Z0000 Encounter for general adult medical examination without abnormal findings: Secondary | ICD-10-CM | POA: Diagnosis not present

## 2024-09-20 LAB — POCT GLYCOSYLATED HEMOGLOBIN (HGB A1C): Hemoglobin A1C: 5.7 % — AB (ref 4.0–5.6)

## 2024-09-20 MED ORDER — VERAPAMIL HCL ER 240 MG PO CP24: 240.0000 mg | ORAL_CAPSULE | Freq: Every day | ORAL | 0 refills | Status: DC

## 2024-09-20 NOTE — Progress Notes (Signed)
    SUBJECTIVE:   Chief compliant/HPI: annual examination  Elijah Washington is a 68 y.o. who presents today for an annual exam.   Complaining of LBP x1 week. States he is pretty active and plays golf. Takes 2 acetaminophen with relief. Denies dysuria, hematuria, fever.   Has been taking Verapamil daily. Brought BP log to visit.  History tabs reviewed and updated.   OBJECTIVE:   BP (!) 146/77   Pulse 70   Ht 5' 7 (1.702 m)   Wt 221 lb (100.2 kg)   SpO2 98%   BMI 34.61 kg/m    General: Alert, well-appearing male in NAD.  HEENT: No sign of trauma, EOM grossly intact. Neck: Supple, normal ROM, no LAD Cardiovascular: RRR, no m/r/g appreciated. Pulmonary: Normal WOB. CTAB with no w/c/r present. Abdomen: Soft, non-tender, non-distended. Extremities: Warm and well-perfused, without cyanosis or edema. Neurologic: Normal gait, moves all four extremities appropriately Skin: No rashes or lesions. Psych: Appropriate mood and affect  ASSESSMENT/PLAN:   Assessment & Plan Healthcare maintenance - Referral placed for colonoscopy - Labs: BMP, Hgb A1C, hep B, HIV, lipid - Flu shot completed at previous visit - Instructed to get Tdap at pharmacy Hypertension, unspecified type Switched from lisinopril  to verapamil 180mg  daily at last visit (10/13) 2/2 facial angeioedema. Patient denies any sx with new medication but blood pressures are not well-controlled. Home BP with SBP 150-179. Initial BP in office 151/86 with repeat 146/77. Patient did bring his home BP monitor which showed higher readings than what was obtained by us . This may be a confounding factor and instructed patient to replace batteries. - Increase Verapamil to 240mg  daily - Continue BP log and bring at next visit - F/u scheduled with me for 10/11/24 at 3:15 PM  Suspect LBP is 2/2 to overuse. No symptoms to indicate UTI or kidney infection. Given it has been ongoing x 1 week, appropriate continue tylenol. Return  precautions discussed. Return if back pain worsens.  Annual Examination  See AVS for age appropriate recommendations.  PHQ score - patient refused screen  Blood pressure value is not at goal, discussed.   Considered the following screening exams based upon USPSTF recommendations: Diabetes screening: ordered HIV testing:ordered Hepatitis C: recently completed and result reviewed, normal  Hepatitis B:ordered Syphilis if at high risk: not at high risk and not ordered GC/CT not at high risk and not ordered. Lipid panel (nonfasting or fasting) discussed based upon AHA recommendations and ordered.  Consider repeat every 4-6 years.  Reviewed risk factors for latent tuberculosis and not indicated  Cancer Screening Discussion  Lung cancer screening:not indicated as does not meet criteria.  See documentation below regarding indications/risks/benefits.  Colorectal cancer screening: discussed, colonoscopy ordered.  PSA discussed and after engaging in discussion of possible risks, benefits and complications of screening. PSA: not indicated Vaccinations - completed at previous visit.   Follow up in 1 year or sooner if indicated.  MyChart Activation: Already signed up   Darren Jernigan, DO [facility]

## 2024-09-20 NOTE — Patient Instructions (Signed)
 Thank you for visiting the clinic today, it was good to see you!  Our plans for today: - Start taking Verapamil 240 mg - Keep a blood pressure log and bring it with you to your next visit - Return if back pain worsens - Referral sent for colonoscopy. You should hear from their office in 1-2 weeks.  Please follow-up on 10/11/24 at 3:15 PM.  Please arrive 15 minutes PRIOR to your next scheduled appointment time! If you do not, this affects OTHER patients' care.  For any questions, please call the office at [phone] or send me a message in MyChart.  It was a pleasure to take care of you today. Have a great day!  Danniel Grenz, DO [facility] Resident, PGY-1  -------------------------------------------------------------------------------  Do you need your medications delivered to your home?   We'll send your prescription to the [facility] for delivery.          Address: [address] [place], [place], [place] [address]          Phone: [phone]  Please call the [facility] to speak with a pharmacist and set up your home medication delivery. If you have any questions, feel free to contact us  -- we're happy to help!  Other [facility] that offer affordable prices on both prescriptions and over-the-counter items, as well as convenient services like vaccinations, are  [facility], at [facility]         Address:  [address], [place], [place] [address]         Phone: [phone]  [facility], located in the [facility]        Address: [address] [place], [place] [address]        Phone: [phone]  [facility], at [facility]       Address: [address], [place], [place] [address]       Phone: [phone]  [facility], at [facility]       Address: [address], [place], [place] [address]       Phone: [phone]

## 2024-09-21 ENCOUNTER — Ambulatory Visit: Payer: Self-pay

## 2024-09-21 LAB — LIPID PANEL
Chol/HDL Ratio: 2.8 ratio (ref 0.0–5.0)
Cholesterol, Total: 150 mg/dL (ref 100–199)
HDL: 54 mg/dL (ref >39)
LDL Chol Calc (NIH): 71 mg/dL (ref 0–99)
Triglycerides: 142 mg/dL (ref 0–149)
VLDL Cholesterol Cal: 25 mg/dL (ref 5–40)

## 2024-09-21 LAB — BASIC METABOLIC PANEL WITH GFR
BUN/Creatinine Ratio: 14 (ref 10–24)
BUN: 13 mg/dL (ref 8–27)
CO2: 25 mmol/L (ref 20–29)
Calcium: 9.4 mg/dL (ref 8.6–10.2)
Chloride: 103 mmol/L (ref 96–106)
Creatinine, Ser: 0.93 mg/dL (ref 0.76–1.27)
Glucose: 85 mg/dL (ref 70–99)
Potassium: 4.4 mmol/L (ref 3.5–5.2)
Sodium: 140 mmol/L (ref 134–144)
eGFR: 88 mL/min/1.73 (ref >59)

## 2024-09-21 LAB — HEPATITIS B SURFACE ANTIGEN: Hepatitis B Surface Ag: NEGATIVE

## 2024-09-21 LAB — HIV ANTIBODY (ROUTINE TESTING W REFLEX): HIV Screen 4th Generation wRfx: NONREACTIVE

## 2024-09-21 NOTE — Assessment & Plan Note (Addendum)
 Switched from lisinopril  to verapamil 180mg  daily at last visit (10/13) 2/2 facial angeioedema. Patient denies any sx with new medication but blood pressures are not well-controlled. Home BP with SBP 150-179. Initial BP in office 151/86 with repeat 146/77. Patient did bring his home BP monitor which showed higher readings than what was obtained by us . This may be a confounding factor and instructed patient to replace batteries. - Increase Verapamil to 240mg  daily - Continue BP log and bring at next visit - F/u scheduled with me for 10/11/24 at 3:15 PM

## 2024-10-11 ENCOUNTER — Ambulatory Visit: Payer: Self-pay

## 2024-10-11 VITALS — BP 138/85 | HR 70 | Wt 222.4 lb

## 2024-10-11 DIAGNOSIS — I10 Essential (primary) hypertension: Secondary | ICD-10-CM | POA: Diagnosis not present

## 2024-10-11 MED ORDER — HYDROCHLOROTHIAZIDE 25 MG PO TABS: 12.5000 mg | ORAL_TABLET | Freq: Every day | ORAL | 0 refills | Status: DC

## 2024-10-11 NOTE — Patient Instructions (Signed)
 Thank you for visiting the clinic today, it was good to see you!  Our plans for today: - We added on another blood pressure medication called Hydrochlorathiazide 12.5 mg. Please take this in addition to Amlodipine 240 mg - Please return on 10/25/24 at 3:15 PM for a follow up to check your blood pressures and repeat labs  Please arrive 15 minutes PRIOR to your next scheduled appointment time! If you do not, this affects OTHER patients' care.  For any questions, please call the office at [phone] or send me a message in MyChart.  It was a pleasure to take care of you today. Have a great day!  Ahsan Esterline, DO [facility] Resident, PGY-1  -----------------------------------------------------------------------------  Do you need your medications delivered to your home?   We'll send your prescription to the [facility] for delivery.          Address: [address] [place], [place], [place] [address]          Phone: [phone]  Please call the [facility] to speak with a pharmacist and set up your home medication delivery. If you have any questions, feel free to contact us  -- we're happy to help!  Other [facility] that offer affordable prices on both prescriptions and over-the-counter items, as well as convenient services like vaccinations, are  [facility], at [facility]         Address: [address], [place], [place] [address]         Phone: [phone]  [facility], located in the [facility]        Address: [address], [place], [place] [address]        Phone: [phone]  [facility], at [facility]       Address: [address], [place], [place] [address]       Phone: [phone]  [facility], at [facility]       Address: [address], [place], [place] [address]        Phone: [phone]  -----------------------------------------------------------------------------  Food Resources [employer] [place] - Free Produce & Food (Every Thursday 9AM to 11:00AM) [employer] [id number], [place], [place]: [address]; [phone] [place]: [address]; [phone]  The [facility] [employer] app, formally [employer], connects those living in [place], [place] & surrounding areas with healthy food options & access to emergency resources. This app aims to alleviate some of the barriers to food access by making the many ways people in [place], [place] & surrounding areas can access food resources publicly available.    This app is the product of the [employer], whose mission is to coordinate and improve the effectiveness of entities in [place] [employer] focused on alleviating hunger by creating and executing citywide and neighborhood-focused initiatives to develop more just and sustainable food systems.

## 2024-10-11 NOTE — Progress Notes (Signed)
    SUBJECTIVE:   CHIEF COMPLAINT / HPI:   Elijah Washington is a 68 y.o.male who presents today for BP f/u. Current medications include verapamil  240 mg which they are taking as prescribed. They do check BP at home which is usually 140-150 systolic. Patient states he has been worried about exercising since his blood pressures have been elevated.   PERTINENT  PMH / PSH: HTN, HLD  OBJECTIVE:   BP 138/85   Pulse 70   Wt 222 lb 6.4 oz (100.9 kg)   SpO2 98%   BMI 34.83 kg/m    General: Alert, well-appearing male in NAD.  Cardiovascular: RRR, no m/r/g appreciated. Pulmonary: Normal WOB. CTAB with no w/c/r present Extremities: Warm and well-perfused, without cyanosis or edema.  ASSESSMENT/PLAN:   Assessment & Plan Hypertension, unspecified type BP today is 138/85. Home readings are still elevated and would like to see BP at goal of < 140/80. Also encouraged patient to incorporate more exercise into his routine as this can aid with BP and overall heart health. - Changes to current regimen include addition of hydrochlorothiazide 12.5 mg daily - Consider switching to combination amlodipine-hydrochlorothiazide if BPs are well-controlled at next visit - Consider BMP at next visit - Follow up has been scheduled with me for 10/25/24 at 3:15 PM    Darren Jernigan, DO [facility]

## 2024-10-11 NOTE — Assessment & Plan Note (Signed)
 BP today is 138/85. Home readings are still elevated and would like to see BP at goal of < 140/80. Also encouraged patient to incorporate more exercise into his routine as this can aid with BP and overall heart health. - Changes to current regimen include addition of hydrochlorothiazide 12.5 mg daily - Consider switching to combination amlodipine-hydrochlorothiazide if BPs are well-controlled at next visit - Consider BMP at next visit - Follow up has been scheduled with me for 10/25/24 at 3:15 PM

## 2024-10-12 ENCOUNTER — Other Ambulatory Visit: Payer: Self-pay

## 2024-10-25 ENCOUNTER — Ambulatory Visit: Payer: Self-pay

## 2024-10-25 VITALS — BP 136/83 | HR 66 | Ht 67.0 in | Wt 221.4 lb

## 2024-10-25 DIAGNOSIS — I10 Essential (primary) hypertension: Secondary | ICD-10-CM

## 2024-10-25 MED ORDER — AMLODIPINE BESYLATE 5 MG PO TABS: 5.0000 mg | ORAL_TABLET | Freq: Every day | ORAL | 0 refills | Status: AC

## 2024-10-25 NOTE — Progress Notes (Signed)
° ° °  SUBJECTIVE:   CHIEF COMPLAINT / HPI:   Robi A. Grieser is a 68 y.o.male who presents today for BP f/u. Current medications include verapamil  240mg  and hydrochlorothiazide  12.5mg  which they are taking as prescribed. They do check BP at home which is usually 132-167 systolic and 72-107 diastolic. Doing well with incorporating exercise.  PERTINENT  PMH / PSH: HTN, HLD  OBJECTIVE:   BP 136/83   Pulse 66   Ht 5' 7 (1.702 m)   Wt 221 lb 6 oz (100.4 kg)   SpO2 97%   BMI 34.67 kg/m    General: Alert, well-appearing male in NAD, pleasant, able to participate in exam Respiratory: Normal effort of breathing on RA Extremities: Moves all four extremities appropriately, normal gait Psych: Appropriate mood and affect   ASSESSMENT/PLAN:   Assessment & Plan Hypertension, unspecified type BP 136/83 in the office which is at goal but it is elevated to 160 systolic with home readings. Would like home readings to be < 140/80 - Discontinue Verapamil  240 mg - Start Amlodipine  5 mg daily which was sent to the pharmacy - Continue hydrochlorothiazide  12.5 mg daily - Patient to schedule nurse visit for BP check in 1-2 weeks - Instructed to send BP log via MyChart. Will adjust meds as needed - BMP today - Continue lifestyle changes    Darren Jernigan, DO [facility]

## 2024-10-25 NOTE — Patient Instructions (Signed)
 Thank you for visiting the clinic today, it was good to see you!  Our plans for today: - STOP taking Verapamil  240 mg - START Amlodipine  5 mg daily. I have sent this to your pharmacy - CONTINUE hydrochlorothiazide  12.5 mg daily - We got some labs today. I will send you a MyChart message or a letter if results are normal. Otherwise, I will give you a call. - Send me your blood pressure log via MyChart  Please make a visit for blood pressure check in 1-2 weeks.   Please arrive 15 minutes PRIOR to your next scheduled appointment time! If you do not, this affects OTHER patients' care.  For any questions, please call the office at [phone] or send me a message in MyChart.  It was a pleasure to take care of you today. Have a great day!  Teresha Hanks, DO [facility] Resident, PGY-1

## 2024-10-25 NOTE — Assessment & Plan Note (Signed)
 BP 136/83 in the office which is at goal but it is elevated to 160 systolic with home readings. Would like home readings to be < 140/80 - Discontinue Verapamil  240 mg - Start Amlodipine  5 mg daily which was sent to the pharmacy - Continue hydrochlorothiazide  12.5 mg daily - Patient to schedule nurse visit for BP check in 1-2 weeks - Instructed to send BP log via MyChart. Will adjust meds as needed - BMP today - Continue lifestyle changes

## 2024-10-26 LAB — BASIC METABOLIC PANEL WITH GFR
BUN/Creatinine Ratio: 15 (ref 10–24)
BUN: 17 mg/dL (ref 8–27)
CO2: 23 mmol/L (ref 20–29)
Calcium: 9.6 mg/dL (ref 8.6–10.2)
Chloride: 101 mmol/L (ref 96–106)
Creatinine, Ser: 1.16 mg/dL (ref 0.76–1.27)
Glucose: 91 mg/dL (ref 70–99)
Potassium: 4.4 mmol/L (ref 3.5–5.2)
Sodium: 143 mmol/L (ref 134–144)
eGFR: 68 mL/min/1.73

## 2024-11-02 ENCOUNTER — Ambulatory Visit: Payer: Self-pay

## 2024-11-06 ENCOUNTER — Ambulatory Visit: Payer: Self-pay

## 2024-11-06 VITALS — BP 122/66 | HR 68

## 2024-11-06 DIAGNOSIS — I10 Essential (primary) hypertension: Secondary | ICD-10-CM

## 2024-11-06 NOTE — Progress Notes (Signed)
 Patient here today for BP check.      Last BP was on 10/25/2024 and was 136/83.  BP today is 122/66 with a pulse of 68.    Checked BP in left arm with large cuff.    Symptoms present: None.   Patient last took BP med amlodipine  at bedtime last night and hydrochlorothiazide  this morning.   Routed note to PCP.

## 2024-11-22 ENCOUNTER — Encounter: Payer: Self-pay | Admitting: Pharmacist

## 2024-11-22 NOTE — Progress Notes (Signed)
 This patient is appearing on a report for being at risk of failing the adherence measure for hypertension (ACEi/ARB) medications this calendar year.   Medication: lisinopril  10 mg  Per chart review, medication discontinued in August 2025 due to throat/tongue swelling.   Reviewed medication indication, dosing, and goals of therapy.

## 2024-12-03 ENCOUNTER — Other Ambulatory Visit: Payer: Self-pay

## 2025-04-29 ENCOUNTER — Encounter
